# Patient Record
Sex: Female | Born: 1965
Health system: Southern US, Community
[De-identification: ages and names within clinical notes are randomized; demographics above are authoritative.]

## PROBLEM LIST (undated history)

## (undated) DIAGNOSIS — R7303 Prediabetes: Secondary | ICD-10-CM

## (undated) DIAGNOSIS — I10 Essential (primary) hypertension: Secondary | ICD-10-CM

---

## 2019-01-13 ENCOUNTER — Emergency Department (HOSPITAL_COMMUNITY)
Admission: EM | Admit: 2019-01-13 | Discharge: 2019-01-13 | Disposition: A | Discharge status: Home / Self Care | Payer: Federal, State, Local not specified - PPO | Attending: Emergency Medicine | Admitting: Emergency Medicine

## 2019-01-13 ENCOUNTER — Encounter (HOSPITAL_COMMUNITY): Payer: Self-pay | Admitting: *Deleted

## 2019-01-13 DIAGNOSIS — W57XXXA Bitten or stung by nonvenomous insect and other nonvenomous arthropods, initial encounter: Secondary | ICD-10-CM | POA: Insufficient documentation

## 2019-01-13 DIAGNOSIS — Y990 Civilian activity done for income or pay: Secondary | ICD-10-CM | POA: Insufficient documentation

## 2019-01-13 DIAGNOSIS — Y9389 Activity, other specified: Secondary | ICD-10-CM | POA: Diagnosis not present

## 2019-01-13 DIAGNOSIS — L089 Local infection of the skin and subcutaneous tissue, unspecified: Secondary | ICD-10-CM | POA: Diagnosis not present

## 2019-01-13 DIAGNOSIS — S80862A Insect bite (nonvenomous), left lower leg, initial encounter: Secondary | ICD-10-CM

## 2019-01-13 DIAGNOSIS — I10 Essential (primary) hypertension: Secondary | ICD-10-CM | POA: Diagnosis not present

## 2019-01-13 DIAGNOSIS — Y92242 Post office as the place of occurrence of the external cause: Secondary | ICD-10-CM | POA: Diagnosis not present

## 2019-01-13 DIAGNOSIS — R2242 Localized swelling, mass and lump, left lower limb: Secondary | ICD-10-CM | POA: Diagnosis not present

## 2019-01-13 HISTORY — DX: Essential (primary) hypertension: I10

## 2019-01-13 MED ORDER — AMLODIPINE BESYLATE 5 MG PO TABS: 5.0000 mg | ORAL_TABLET | Freq: Every day | ORAL | 0 refills | Status: DC

## 2019-01-13 NOTE — Discharge Instructions (Signed)
Follow up with a family doctor.  Keep the area covered to avoid irritation.  Benadryl and medicines like ibuprofen on naproxyn can help with the pain/itching.  Return for fever, rapid spreading redness.

## 2019-01-13 NOTE — ED Provider Notes (Signed)
[facility]-EMERGENCY DEPT Provider Note   CSN: [id number] Arrival date & time: 01/13/19  1821    History   Chief Complaint Chief Complaint  Patient presents with  . Leg Pain    HPI Joy Leonard is a 53 y.o. female.     53 yo F with a chief complaint of pain and swelling to the posterior aspect of her left calf.  This been going on for the past couple days.  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  When she got done with work she realized that there was a reddened swollen area.  Since then the blister is formed and become more tense.  She denies other areas of injury.  Denies shortness of breath vomiting or diarrhea.  The patient also has been without her antihypertensive medications as she is recently moved to the area.  Requesting a refill of her amlodipine.  The history is provided by the patient and a relative.  Leg Pain  Location:  Leg Time since incident:  2 days Injury: no   Leg location:  R lower leg Pain details:    Quality: itching.   Radiates to:  Does not radiate   Severity:  Mild   Onset quality:  Sudden   Duration:  2 days   Timing:  Constant   Progression:  Worsening Chronicity:  New Dislocation: no   Prior injury to area:  No Relieved by:  Nothing Worsened by:  Nothing Ineffective treatments:  None tried Associated symptoms: no fever     Past Medical History:  Diagnosis Date  . Hypertension     There are no active problems to display for this patient.   History reviewed. No pertinent surgical history.   OB History   No obstetric history on file.      Home Medications    Prior to Admission medications   Medication Sig Start Date End Date Taking? Authorizing Provider  amLODipine (NORVASC) 5 MG tablet Take 1 tablet (5 mg total) by mouth daily. 01/13/19   Melene Plan, DO    Family History No family history on file.  Social History Social History   Tobacco Use  .  Smoking status: Never Smoker  . Smokeless tobacco: Never Used  Substance Use Topics  . Alcohol use: Yes  . Drug use: Not on file     Allergies   Lisinopril   Review of Systems Review of Systems  Constitutional: Negative for chills and fever.  HENT: Negative for congestion and rhinorrhea.   Eyes: Negative for redness and visual disturbance.  Respiratory: Negative for shortness of breath and wheezing.   Cardiovascular: Negative for chest pain and palpitations.  Gastrointestinal: Negative for nausea and vomiting.  Genitourinary: Negative for dysuria and urgency.  Musculoskeletal: Negative for arthralgias and myalgias.  Skin: Positive for color change. Negative for pallor and wound.  Neurological: Negative for dizziness and headaches.     Physical Exam Updated Vital Signs BP (!) 180/112 (BP Location: Left Arm)   Pulse 77   Temp 98 F (36.7 C) (Oral)   Resp 18   SpO2 100%   Physical Exam Vitals signs and nursing note reviewed.  Constitutional:      General: She is not in acute distress.    Appearance: She is well-developed. She is not diaphoretic.  HENT:     Head: Normocephalic and atraumatic.  Eyes:     Pupils: Pupils are equal, round, and reactive to  light.  Neck:     Musculoskeletal: Normal range of motion and neck supple.  Cardiovascular:     Rate and Rhythm: Normal rate and regular rhythm.     Heart sounds: No murmur. No friction rub. No gallop.   Pulmonary:     Effort: Pulmonary effort is normal.     Breath sounds: No wheezing or rales.  Abdominal:     General: There is no distension.     Palpations: Abdomen is soft.     Tenderness: There is no abdominal tenderness.  Musculoskeletal:        General: No tenderness.  Skin:    General: Skin is warm and dry.     Comments: Raised area of erythema with a centralized fluid-filled blister.  Nontender.  Neurological:     Mental Status: She is alert and oriented to person, place, and time.  Psychiatric:         Behavior: Behavior normal.      ED Treatments / Results  Labs (all labs ordered are listed, but only abnormal results are displayed) Labs Reviewed - No data to display  EKG None  Radiology No results found.  Procedures Procedures (including critical care time)  Medications Ordered in ED Medications - No data to display   Initial Impression / Assessment and Plan / ED Course  I have reviewed the triage vital signs and the nursing notes.  Pertinent labs & imaging results that were available during my care of the patient were reviewed by me and considered in my medical decision making (see chart for details).        53 yo F with a chief complaint of an area of erythema and blistering.  Clinically this appears to be an insect bite with a localized reaction.  She has no other lesions no other signs of allergic reaction.  We will have her do symptomatic therapy at home.  PCP follow-up.  7:08 PM:  I have discussed the diagnosis/risks/treatment options with the patient and family and believe the pt to be eligible for discharge home to follow-up with PCP. We also discussed returning to the ED immediately if new or worsening sx occur. We discussed the sx which are most concerning (e.g., sudden worsening pain, fever, inability to tolerate by mouth) that necessitate immediate return. Medications administered to the patient during their visit and any new prescriptions provided to the patient are listed below.  Medications given during this visit Medications - No data to display   The patient appears reasonably screen and/or stabilized for discharge and I doubt any other medical condition or other [facility] requiring further screening, evaluation, or treatment in the ED at this time prior to discharge.    Final Clinical Impressions(s) / ED Diagnoses   Final diagnoses:  Insect bite of left lower leg with local reaction, initial encounter    ED Discharge Orders         Ordered     amLODipine (NORVASC) 5 MG tablet  Daily     01/13/19 1904           Melene Plan, DO 01/13/19 1908

## 2019-01-13 NOTE — ED Triage Notes (Signed)
Pt complains of left calf pain. Pt has blister on left calf. Pt is hypertensive in triage. Pt states she has not taken blood pressure medication since mid-February. Pt denies headache, blurred vision, or dizziness.

## 2019-01-31 ENCOUNTER — Encounter (HOSPITAL_COMMUNITY): Payer: Self-pay | Admitting: Emergency Medicine

## 2019-01-31 ENCOUNTER — Ambulatory Visit (HOSPITAL_COMMUNITY)
Admission: EM | Admit: 2019-01-31 | Discharge: 2019-01-31 | Disposition: A | Discharge status: Home / Self Care | Payer: Federal, State, Local not specified - PPO | Attending: Family Medicine | Admitting: Family Medicine

## 2019-01-31 ENCOUNTER — Other Ambulatory Visit: Payer: Self-pay

## 2019-01-31 DIAGNOSIS — I10 Essential (primary) hypertension: Secondary | ICD-10-CM | POA: Diagnosis not present

## 2019-01-31 MED ORDER — AMLODIPINE BESYLATE 10 MG PO TABS: 10.0000 mg | ORAL_TABLET | Freq: Every day | ORAL | 2 refills | Status: DC

## 2019-01-31 NOTE — ED Notes (Signed)
Patient verbalizes understanding of discharge instructions. Opportunity for questioning and answers were provided. Patient discharged from [facility] by MD. 

## 2019-01-31 NOTE — ED Triage Notes (Signed)
Requesting medication refill.  Amlodipine 5 mg-patient has 3 left.

## 2019-02-05 NOTE — ED Provider Notes (Signed)
[facility]   [id number] 01/31/19 Arrival Time: 1737  ASSESSMENT & PLAN:  1. Essential hypertension    Meds ordered this encounter  Medications  . amLODipine (NORVASC) 10 MG tablet    Sig: Take 1 tablet (10 mg total) by mouth daily.    Dispense:  30 tablet    Refill:  2   Refilled as requested; increase to 10mg . Encouraged to est care with PCP.  Reviewed expectations re: course of current medical issues. Questions answered. Outlined signs and symptoms indicating need for more acute intervention. Patient verbalized understanding. After Visit Summary given.   SUBJECTIVE:  Joy Leonard is a 53 y.o. female who presents with concerns regarding increased blood pressures. Needs refill of Norvasc 5mg . Reports that she will run out soon. Checks BP at home and reports consistent elevation above normal.  She reports taking medications as instructed, no medication side effects noted, no TIA's, no chest pain on exertion, no dyspnea on exertion, no swelling of ankles, no orthostatic dizziness or lightheadedness, no orthopnea or paroxysmal nocturnal dyspnea and no palpitations.  Denies symptoms of chest pain, palpations, orthopnea, nocturnal dyspnea, or LE edema.  Social History   Tobacco Use  Smoking Status Never Smoker  Smokeless Tobacco Never Used   ROS: As per HPI.   OBJECTIVE:  Vitals:   01/31/19 1815  BP: (!) 161/95  Pulse: 82  Resp: 18  Temp: 98.3 F (36.8 C)  TempSrc: Oral  SpO2: 99%    Elevated BP noted.  General appearance: alert; no distress Eyes: PERRLA; EOMI HENT: normocephalic; atraumatic Neck: supple Lungs: clear to auscultation bilaterally Heart: regular rate and rhythm Abdomen: soft, non-tender; bowel sounds normal Extremities: no edema; symmetrical with no gross deformities Skin: warm and dry Psychological: alert and cooperative; normal mood and affect  Allergies  Allergen Reactions  . Lisinopril Swelling    Past Medical  History:  Diagnosis Date  . Hypertension    Social History   Socioeconomic History  . Marital status: Single    Spouse name: Not on file  . Number of children: Not on file  . Years of education: Not on file  . Highest education level: Not on file  Occupational History  . Not on file  Social Needs  . Financial resource strain: Not on file  . Food insecurity:    Worry: Not on file    Inability: Not on file  . Transportation needs:    Medical: Not on file    Non-medical: Not on file  Tobacco Use  . Smoking status: Never Smoker  . Smokeless tobacco: Never Used  Substance and Sexual Activity  . Alcohol use: Yes  . Drug use: Not on file  . Sexual activity: Not on file  Lifestyle  . Physical activity:    Days per week: Not on file    Minutes per session: Not on file  . Stress: Not on file  Relationships  . Social connections:    Talks on phone: Not on file    Gets together: Not on file    Attends religious service: Not on file    Active member of club or organization: Not on file    Attends meetings of clubs or organizations: Not on file    Relationship status: Not on file  . Intimate partner violence:    Fear of current or ex partner: Not on file    Emotionally abused: Not on file    Physically abused: Not on file    Forced  sexual activity: Not on file  Other Topics Concern  . Not on file  Social History Narrative  . Not on file   Family History  Problem Relation Age of Onset  . Hypertension Mother    History reviewed. No pertinent surgical history.    Mardella LaymanHagler, Laporscha Linehan, MD 02/05/19 1406

## 2019-03-02 ENCOUNTER — Inpatient Hospital Stay (HOSPITAL_COMMUNITY)
Admission: EM | Admit: 2019-03-02 | Discharge: 2019-03-05 | DRG: 244 | Disposition: A | Discharge status: Home / Self Care | Payer: Federal, State, Local not specified - PPO | Attending: Internal Medicine | Admitting: Internal Medicine

## 2019-03-02 ENCOUNTER — Emergency Department (HOSPITAL_COMMUNITY): Payer: Federal, State, Local not specified - PPO

## 2019-03-02 ENCOUNTER — Other Ambulatory Visit: Payer: Self-pay

## 2019-03-02 ENCOUNTER — Encounter (HOSPITAL_COMMUNITY): Payer: Self-pay | Admitting: Emergency Medicine

## 2019-03-02 DIAGNOSIS — I4510 Unspecified right bundle-branch block: Secondary | ICD-10-CM | POA: Diagnosis not present

## 2019-03-02 DIAGNOSIS — Z20828 Contact with and (suspected) exposure to other viral communicable diseases: Secondary | ICD-10-CM | POA: Diagnosis not present

## 2019-03-02 DIAGNOSIS — Z888 Allergy status to other drugs, medicaments and biological substances status: Secondary | ICD-10-CM

## 2019-03-02 DIAGNOSIS — I442 Atrioventricular block, complete: Secondary | ICD-10-CM | POA: Diagnosis not present

## 2019-03-02 DIAGNOSIS — R42 Dizziness and giddiness: Secondary | ICD-10-CM | POA: Diagnosis not present

## 2019-03-02 DIAGNOSIS — R7303 Prediabetes: Secondary | ICD-10-CM | POA: Diagnosis present

## 2019-03-02 DIAGNOSIS — Z833 Family history of diabetes mellitus: Secondary | ICD-10-CM

## 2019-03-02 DIAGNOSIS — I10 Essential (primary) hypertension: Secondary | ICD-10-CM | POA: Diagnosis present

## 2019-03-02 DIAGNOSIS — Z79899 Other long term (current) drug therapy: Secondary | ICD-10-CM

## 2019-03-02 DIAGNOSIS — Z950 Presence of cardiac pacemaker: Secondary | ICD-10-CM

## 2019-03-02 DIAGNOSIS — R079 Chest pain, unspecified: Secondary | ICD-10-CM | POA: Diagnosis not present

## 2019-03-02 DIAGNOSIS — Z8249 Family history of ischemic heart disease and other diseases of the circulatory system: Secondary | ICD-10-CM

## 2019-03-02 LAB — CBC
HCT: 36 % (ref 36.0–46.0)
HCT: 36.2 % (ref 36.0–46.0)
Hemoglobin: 11.1 g/dL — ABNORMAL LOW (ref 12.0–15.0)
Hemoglobin: 11.5 g/dL — ABNORMAL LOW (ref 12.0–15.0)
MCH: 21.1 pg — ABNORMAL LOW (ref 26.0–34.0)
MCH: 21 pg — ABNORMAL LOW (ref 26.0–34.0)
MCHC: 30.8 g/dL (ref 30.0–36.0)
MCHC: 31.8 g/dL (ref 30.0–36.0)
MCV: 68.3 fL — ABNORMAL LOW (ref 80.0–100.0)
MCV: 66.1 fL — ABNORMAL LOW (ref 80.0–100.0)
Platelets: 168 10*3/uL (ref 150–400)
Platelets: 180 10*3/uL (ref 150–400)
RBC: 5.27 MIL/uL — ABNORMAL HIGH (ref 3.87–5.11)
RBC: 5.48 MIL/uL — ABNORMAL HIGH (ref 3.87–5.11)
RDW: 18.3 % — ABNORMAL HIGH (ref 11.5–15.5)
RDW: 17.3 % — ABNORMAL HIGH (ref 11.5–15.5)
WBC: 6.4 10*3/uL (ref 4.0–10.5)
WBC: 6.8 10*3/uL (ref 4.0–10.5)
nRBC: 0 % (ref 0.0–0.2)
nRBC: 0 % (ref 0.0–0.2)

## 2019-03-02 LAB — TROPONIN I
Troponin I: <0.03 ng/mL (ref <0.03)
Troponin I: <0.03 ng/mL (ref <0.03)

## 2019-03-02 LAB — BASIC METABOLIC PANEL
Anion gap: 9 (ref 5–15)
BUN: 25 mg/dL — ABNORMAL HIGH (ref 6–20)
CO2: 24 mmol/L (ref 22–32)
Calcium: 9.2 mg/dL (ref 8.9–10.3)
Chloride: 109 mmol/L (ref 98–111)
Creatinine, Ser: 0.85 mg/dL (ref 0.44–1.00)
GFR calc Af Amer: >60 mL/min (ref >60)
GFR calc non Af Amer: >60 mL/min (ref >60)
Glucose, Bld: 114 mg/dL — ABNORMAL HIGH (ref 70–99)
Potassium: 3.6 mmol/L (ref 3.5–5.1)
Sodium: 142 mmol/L (ref 135–145)

## 2019-03-02 LAB — CREATININE, SERUM
Creatinine, Ser: 0.75 mg/dL (ref 0.44–1.00)
GFR calc Af Amer: >60 mL/min (ref >60)
GFR calc non Af Amer: >60 mL/min (ref >60)

## 2019-03-02 LAB — MAGNESIUM: Magnesium: 2.3 mg/dL (ref 1.7–2.4)

## 2019-03-02 LAB — SARS CORONAVIRUS 2 BY RT PCR (HOSPITAL ORDER, PERFORMED IN ~~LOC~~ HOSPITAL LAB): SARS Coronavirus 2: NEGATIVE

## 2019-03-02 LAB — TSH: TSH: 1.107 u[IU]/mL (ref 0.350–4.500)

## 2019-03-02 LAB — HEMOGLOBIN A1C
Hgb A1c MFr Bld: 6.6 % — ABNORMAL HIGH (ref 4.8–5.6)
Mean Plasma Glucose: 142.72 mg/dL

## 2019-03-02 LAB — T4, FREE: Free T4: 0.77 ng/dL — ABNORMAL LOW (ref 0.82–1.77)

## 2019-03-02 LAB — MRSA PCR SCREENING: MRSA by PCR: NEGATIVE

## 2019-03-02 MED ORDER — ADULT MULTIVITAMIN W/MINERALS CH
1.0000 | ORAL_TABLET | Freq: Every day | ORAL | Status: DC
  Administered 2019-03-03 – 2019-03-05 (×3): 1 via ORAL
  Filled 2019-03-02 (×4): qty 1

## 2019-03-02 MED ORDER — ASPIRIN 81 MG PO CHEW: 324.0000 mg | CHEWABLE_TABLET | ORAL | Status: DC

## 2019-03-02 MED ORDER — AMLODIPINE BESYLATE 10 MG PO TABS
10.0000 mg | ORAL_TABLET | Freq: Every day | ORAL | Status: DC
  Administered 2019-03-03 – 2019-03-05 (×3): 10 mg via ORAL
  Filled 2019-03-02 (×4): qty 1

## 2019-03-02 MED ORDER — ASPIRIN 300 MG RE SUPP: 300.0000 mg | RECTAL | Status: DC

## 2019-03-02 MED ORDER — ASPIRIN EC 81 MG PO TBEC
81.0000 mg | DELAYED_RELEASE_TABLET | Freq: Every day | ORAL | Status: DC
  Administered 2019-03-03 – 2019-03-05 (×3): 81 mg via ORAL
  Filled 2019-03-02 (×5): qty 1

## 2019-03-02 MED ORDER — ACETAMINOPHEN 325 MG PO TABS: 650.0000 mg | ORAL_TABLET | ORAL | Status: DC | PRN

## 2019-03-02 MED ORDER — ONDANSETRON HCL 4 MG/2ML IJ SOLN: 4.0000 mg | Freq: Four times a day (QID) | INTRAMUSCULAR | Status: DC | PRN

## 2019-03-02 MED ORDER — SODIUM CHLORIDE 0.9% FLUSH
3.0000 mL | Freq: Once | INTRAVENOUS | Status: AC
  Administered 2019-03-02: 3 mL via INTRAVENOUS

## 2019-03-02 MED ORDER — NITROGLYCERIN 0.4 MG SL SUBL: 0.4000 mg | SUBLINGUAL_TABLET | SUBLINGUAL | Status: DC | PRN

## 2019-03-02 MED ORDER — ASPIRIN 81 MG PO CHEW
324.0000 mg | CHEWABLE_TABLET | Freq: Once | ORAL | Status: AC
  Administered 2019-03-02: 324 mg via ORAL
  Filled 2019-03-02: qty 4

## 2019-03-02 MED ORDER — HEPARIN SODIUM (PORCINE) 5000 UNIT/ML IJ SOLN
5000.0000 [IU] | Freq: Three times a day (TID) | INTRAMUSCULAR | Status: DC
  Administered 2019-03-02 – 2019-03-03 (×2): 5000 [IU] via SUBCUTANEOUS
  Filled 2019-03-02 (×2): qty 1

## 2019-03-02 NOTE — H&P (Addendum)
Cardiology Admission History and Physical:   Patient ID: Joy Leonard MRN: [id number]; DOB: December 22, 1965   Admission date: 03/02/2019  Primary Care Provider: Patient, No Pcp Per Cardiologist: New  Pt presents to ED [facility]) with chest pain/ dizziness  Patient Profile:   Joy Leonard is a 53 y.o. female with PMH of HTN and possible prediabetes presented with sudden onset of dyspnea, fatigue and chest mild tightness. She was found to be in new 3rd degree AV block by EKG  History of Present Illness:   Joy Leonard is a pleasant 53 year old female with hx of HTN.  She moved down to [place] from [place] in February 2020 to join her children.  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  She denies any prior cardiac issues.  She did mention prior to moving to [place], her previous PCP told her she has prediabetes.  Unfortunately she has not been established with a primary care doctor yet after moving.  She had a C-section in 1990 however no other surgery.  Her maternal aunt had a pacemaker and he eventually passed away due to heart issues.  She does not know what type of heart issues her aunt had.  She does not know her father very well.  Her mother has hypertension and diabetes.  She denies any history of alcohol abuse, tobacco abuse or use of illicit drug.  The only recent event was she went to the emergency room due to bug bite and to request a refill for her amlodipine.  She does not take any other blood pressure medications.  She had a history of angioedema/facial swelling with lisinopril.  She was essentially in her usual state of health until this morning.  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  Around 10 AM this morning, she started noticing increasing fatigue and the dizziness with walking.  This has never happened to her in the past.  She also had a mild chest tightness as well however denies any significant pain.  Symptom persisted  prompting the patient to seek medical attention at [facility] ED.  Initial EKG demonstrated third-degree AV block.  She was subsequently transferred to [facility] for further evaluation.  Lab work included a potassium of 3.6, troponin negative.  Hemoglobin 11.1.  White blood cell count normal.  Chest x-ray shows no acute issue.  Cardiology has been consulted for third-degree heart block.  Patient is resting comfortably in bed denying any chest pain, dizziness or fatigue when she is not moving.  Her systolic blood pressures in the 130s and heart rate 55.    Past Medical History:  Diagnosis Date  . Hypertension     Past Surgical History:  Procedure Laterality Date  . CESAREAN SECTION  1990     Medications Prior to Admission: Prior to Admission medications   Medication Sig Start Date End Date Taking? Authorizing Provider  amLODipine (NORVASC) 10 MG tablet Take 1 tablet (10 mg total) by mouth daily. 01/31/19   Mardella Layman, MD     Allergies:    Allergies  Allergen Reactions  . Lisinopril Swelling    Social History:   Social History   Socioeconomic History  . Marital status: Single    Spouse name: Not on file  . Number of children: Not on file  . Years of education: Not on file  . Highest education level: Not on file  Occupational History  . Not on file  Social Needs  .  Financial resource strain: Not on file  . Food insecurity:    Worry: Not on file    Inability: Not on file  . Transportation needs:    Medical: Not on file    Non-medical: Not on file  Tobacco Use  . Smoking status: Never Smoker  . Smokeless tobacco: Never Used  Substance and Sexual Activity  . Alcohol use: Yes  . Drug use: Never  . Sexual activity: Not on file  Lifestyle  . Physical activity:    Days per week: Not on file    Minutes per session: Not on file  . Stress: Not on file  Relationships  . Social connections:    Talks on phone: Not on file    Gets together: Not on file     Attends religious service: Not on file    Active member of club or organization: Not on file    Attends meetings of clubs or organizations: Not on file    Relationship status: Not on file  . Intimate partner violence:    Fear of current or ex partner: Not on file    Emotionally abused: Not on file    Physically abused: Not on file    Forced sexual activity: Not on file  Other Topics Concern  . Not on file  Social History Narrative  . Not on file    Family History:   The patient's family history includes Hypertension in her mother.    ROS:  Please see the history of present illness.  All other ROS reviewed and negative.     Physical Exam/Data:   Vitals:   03/02/19 1631 03/02/19 1633 03/02/19 1635 03/02/19 1637  BP:  (!) 159/86 (!) 159/86   Pulse:  (!) 55 (!) 55   Resp:  14 19   Temp:      TempSrc:      SpO2: 100% 100% 100%   Weight:    90.7 kg  Height:     (1.626 m)   No intake or output data in the 24 hours ending 03/02/19 1719 Last 3 Weights 03/02/2019  Weight (lbs) 200 lb  Weight (kg) 90.719 kg     Body mass index is 34.33 kg/m.    General:  Well nourished, well developed, in no acute distress HEENT: normal Lymph: no adenopathy Neck: no JVD Endocrine:  No thryomegaly Vascular: No carotid bruits; FA pulses 2+ bilaterally without bruits  Cardiac:  normal S1, S2; RRR; no murmur  Lungs:  clear to auscultation bilaterally, no wheezing, rhonchi or rales  Abd: soft, nontender, no hepatomegaly  Ext: no edema Musculoskeletal:  No deformities, BUE and BLE strength normal and equal Skin: warm and dry  Neuro:  CNs 2-12 intact, no focal abnormalities noted Psych:  Normal affect    EKG:  The ECG that was done 5/2/20was personally reviewed and demonstrates Sinus bradycardia 53 bpm   Complete heart block   RBBB  Relevant CV Studies: None    Laboratory Data:  Chemistry Recent Labs  Lab 03/02/19 1510  NA 142  K 3.6  CL 109  CO2 24  GLUCOSE 114*  BUN 25*   CREATININE 0.85  CALCIUM 9.2  GFRNONAA >60  GFRAA >60  ANIONGAP 9    No results for input(s): PROT, ALBUMIN, AST, ALT, ALKPHOS, BILITOT in the last 168 hours. Hematology Recent Labs  Lab 03/02/19 1510  WBC 6.4  RBC 5.27*  HGB 11.1*  HCT 36.0  MCV 68.3*  MCH 21.1*  MCHC 30.8  RDW 18.3*  PLT 168   Cardiac Enzymes Recent Labs  Lab 03/02/19 1510  TROPONINI <0.03   No results for input(s): TROPIPOC in the last 168 hours.  BNPNo results for input(s): BNP, PROBNP in the last 168 hours.  DDimer No results for input(s): DDIMER in the last 168 hours.  Radiology/Studies:  Dg Chest 2 View  Result Date: 03/02/2019 CLINICAL DATA:  Chest tightness, lightheadedness and weakness while working outside today, chest pain which worsened EXAM: CHEST - 2 VIEW COMPARISON:  None FINDINGS: Normal heart size, mediastinal contours, and pulmonary vascularity. Lungs clear. No pleural effusion or pneumothorax. Bones unremarkable. IMPRESSION: Normal exam. Electronically Signed   By: Ulyses Southward M.D.   On: 03/02/2019 15:00    Assessment and Plan:   1. Conduction dz/Complete heart block  -Patient is not on any AV nodal blocking agent.  Symptoms started suddenly around 10 AM this morning.  Prior to that, patient was in her usual state of health.  -Despite third-degree heart block, her vitals stable and she is currently asymptomatic at rest.  Will admit to stepdown and plan for pacemaker implantation either tomorrow or Monday.  -Obtain TSH.   2.  Chest tightness: Mild chest tightness in the setting of third-degree AV block.  No prior exertional symptom before today.  Will discuss with MD, potentially consider coronary CT since she does not have significant risk factor.  3. HTN: On amlodipine.  No AV nodal blocking agent.  History of angioedema with ACE inhibitor.  4.  Prediabetes: She was informed of this by her previous PCP prior to leaving [place].  Will obtain hemoglobin A1c.    Severity of  Illness: The appropriate patient status for this patient is INPATIENT. Inpatient status is judged to be reasonable and necessary in order to provide the required intensity of service to ensure the patient's safety. The patient's presenting symptoms, physical exam findings, and initial radiographic and laboratory data in the context of their chronic comorbidities is felt to place them at high risk for further clinical deterioration. Furthermore, it is not anticipated that the patient will be medically stable for discharge from the hospital within 2 midnights of admission. The following factors support the patient status of inpatient.   " The patient's presenting symptoms include dyspnea and fatigue. " The worrisome physical exam findings include benign " The initial radiographic and laboratory data are worrisome because of third degree AV block. " The chronic co-morbidities include HTN.   * I certify that at the point of admission it is my clinical judgment that the patient will require inpatient hospital care spanning beyond 2 midnights from the point of admission due to high intensity of service, high risk for further deterioration and high frequency of surveillance required.*    For questions or updates, please contact [facility] Please consult [email or web address] for contact info under        Signed, Azalee Course, [place]  03/02/2019 5:19 PM   Patient seen and examined with the above-signed Advanced Practice Provider and/or Housestaff. I personally reviewed laboratory data, imaging studies and relevant notes. I independently examined the patient and formulated the important aspects of the plan. I have edited the note to reflect any of my changes or salient points. I have personally discussed the plan with the patient and/or family.  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  Very active. Denies any h/o cardiac issues. Presented with exertional fatigue and dizziness with  mild chest tightness. Found to have CHG with underlying RBBB. HRs in 50s. Troponin negative. K ok. Not on any AV nodal blockers.   On exam comfortable 2/6 TR  Suspect CHB due to underlying conduction disease but seems premature. Doubt ischemic. Will get echo. If EF normal, would favor cMRI prior to PPM to assess for possible infiltrative disease. Will d/w EP in am. Admit to SDU with [facility] at bedside.   Arvilla Meresaniel , MD  6:34 PM

## 2019-03-02 NOTE — ED Notes (Signed)
[facility] at bedside for pt transportation.

## 2019-03-02 NOTE — ED Notes (Signed)
ED TO INPATIENT HANDOFF REPORT  ED Nurse Name and Phone #: Everlene FarrierHilary Darleen Moffitt, RN [phone]  S Name/Age/Gender Joy Leonard 53 y.o. female Room/Bed: 034C/034C  Code Status   Code Status: Not on file  Home/SNF/Other Home Patient oriented to: self, place, time and situation Is this baseline? Yes   Triage Complete: Triage complete  Chief Complaint dizziness  Triage Note Pt c/o dizziness and chest tightness while working today. Pt states symptoms worsen with ambulation.    Allergies Allergies  Allergen Reactions  . Lisinopril Swelling    Level of Care/Admitting Diagnosis ED Disposition    ED Disposition Condition Comment   Admit  [facility] [[id number]]  Level of Care: Progressive [102]  Covid Evaluation: Person Under Investigation (PUI)  Isolation Risk Level: Comment  Comment: COVID 19 test negative  Diagnosis: Complete heart block [facility](HCC) [[id number]]) [[id number]]  Admitting Physician: Dolores PattyBENSIMHON, DANIEL R [[id number]]  Attending Physician: Dolores PattyBENSIMHON, DANIEL R [[id number]]  Estimated length of stay: past midnight tomorrow  Certification:: I certify this patient will need inpatient services for at least 2 midnights  PT Class (Do Not Modify): Inpatient [101]  PT Acc Code (Do Not Modify): Private [1]       B Medical/Surgery History Past Medical History:  Diagnosis Date  . Hypertension    Past Surgical History:  Procedure Laterality Date  . CESAREAN SECTION  1990     A IV Location/Drains/Wounds Patient Lines/Drains/Airways Status   Active Line/Drains/Airways    None          Intake/Output Last 24 hours No intake or output data in the 24 hours ending 03/02/19 1742  Labs/Imaging Results for orders placed or performed during the hospital encounter of 03/02/19 (from the past 48 hour(s))  Basic metabolic panel     Status: Abnormal   Collection Time: 03/02/19  3:10 PM  Result Value Ref Range   Sodium 142 135 - 145 mmol/L   Potassium 3.6 3.5 - 5.1  mmol/L   Chloride 109 98 - 111 mmol/L   CO2 24 22 - 32 mmol/L   Glucose, Bld 114 (H) 70 - 99 mg/dL   BUN 25 (H) 6 - 20 mg/dL   Creatinine, Ser 4.690.85 0.44 - 1.00 mg/dL   Calcium 9.2 8.9 - 62.910.3 mg/dL   GFR calc non Af Amer >60 >60 mL/min   GFR calc Af Amer >60 >60 mL/min   Anion gap 9 5 - 15    Comment: Performed at [facility], [address] [place], [place] [address]  CBC     Status: Abnormal   Collection Time: 03/02/19  3:10 PM  Result Value Ref Range   WBC 6.4 4.0 - 10.5 K/uL   RBC 5.27 (H) 3.87 - 5.11 MIL/uL   Hemoglobin 11.1 (L) 12.0 - 15.0 g/dL   HCT 32.436.0 40.136.0 - 02.746.0 %   MCV 68.3 (L) 80.0 - 100.0 fL   MCH 21.1 (L) 26.0 - 34.0 pg   MCHC 30.8 30.0 - 36.0 g/dL   RDW 25.318.3 (H) 66.411.5 - 40.315.5 %   Platelets 168 150 - 400 K/uL    Comment: REPEATED TO VERIFY PLATELET COUNT CONFIRMED BY SMEAR SPECIMEN CHECKED FOR CLOTS    nRBC 0.0 0.0 - 0.2 %    Comment: Performed at [facility], [address] [place], [place] [address]  Troponin I - ONCE - STAT     Status: None   Collection Time: 03/02/19  3:10 PM  Result Value Ref  Range   Troponin I <0.03 <0.03 ng/mL    Comment: Performed at [facility], [address], [place], [place] [address]  Magnesium     Status: None   Collection Time: 03/02/19  3:11 PM  Result Value Ref Range   Magnesium 2.3 1.7 - 2.4 mg/dL    Comment: Performed at [facility], [address], [place], [place] [address]  SARS Coronavirus 2 [facility] order, Performed in [facility] hospital lab)     Status: None   Collection Time: 03/02/19  3:24 PM  Result Value Ref Range   SARS Coronavirus 2 NEGATIVE NEGATIVE    Comment: (NOTE) If result is NEGATIVE SARS-CoV-2 target nucleic acids are NOT DETECTED. The SARS-CoV-2 RNA is generally detectable in upper and lower  respiratory specimens during the acute phase of infection. The lowest  concentration of SARS-CoV-2 viral copies this assay can  detect is 250  copies / mL. A negative result does not preclude SARS-CoV-2 infection  and should not be used as the sole basis for treatment or other  patient management decisions.  A negative result may occur with  improper specimen collection / handling, submission of specimen other  than nasopharyngeal swab, presence of viral mutation(s) within the  areas targeted by this assay, and inadequate number of viral copies  (<250 copies / mL). A negative result must be combined with clinical  observations, patient history, and epidemiological information. If result is POSITIVE SARS-CoV-2 target nucleic acids are DETECTED. The SARS-CoV-2 RNA is generally detectable in upper and lower  respiratory specimens dur ing the acute phase of infection.  Positive  results are indicative of active infection with SARS-CoV-2.  Clinical  correlation with patient history and other diagnostic information is  necessary to determine patient infection status.  Positive results do  not rule out bacterial infection or co-infection with other viruses. If result is PRESUMPTIVE POSTIVE SARS-CoV-2 nucleic acids MAY BE PRESENT.   A presumptive positive result was obtained on the submitted specimen  and confirmed on repeat testing.  While 2019 novel coronavirus  (SARS-CoV-2) nucleic acids may be present in the submitted sample  additional confirmatory testing may be necessary for epidemiological  and / or clinical management purposes  to differentiate between  SARS-CoV-2 and other Sarbecovirus currently known to infect humans.  If clinically indicated additional testing with an alternate test  methodology [id number]) is advised. The SARS-CoV-2 RNA is generally  detectable in upper and lower respiratory sp ecimens during the acute  phase of infection. The expected result is Negative. Fact Sheet for Patients:  BoilerBrush.com.cy Fact Sheet for Healthcare  Providers: https://pope.com/ This test is not yet approved or cleared by the [place] FDA and has been authorized for detection and/or diagnosis of SARS-CoV-2 by FDA under an Emergency Use Authorization (EUA).  This EUA will remain in effect (meaning this test can be used) for the duration of the COVID-19 declaration under Section 564(b)(1) of the Act, 21 U.S.C. section 360bbb-3(b)(1), unless the authorization is terminated or revoked sooner. Performed at [facility], [address] [place], [place] [address]    Dg Chest 2 View  Result Date: 03/02/2019 CLINICAL DATA:  Chest tightness, lightheadedness and weakness while working outside today, chest pain which worsened EXAM: CHEST - 2 VIEW COMPARISON:  None FINDINGS: Normal heart size, mediastinal contours, and pulmonary vascularity. Lungs clear. No pleural effusion or pneumothorax. Bones unremarkable. IMPRESSION: Normal exam. Electronically Signed   By: Angelyn Punt.D.  On: 03/02/2019 15:00    Pending Labs [employer] (From admission, onward)    Start     Ordered   Signed and Held  HIV antibody (Routine Testing)  Once,   R     Signed and Held   Signed and Held  CBC  (heparin)  Once,   R    Comments:  Baseline for heparin therapy IF NOT ALREADY DRAWN.  Notify MD if PLT < 100 K.    Signed and Held   Signed and Held  Creatinine, serum  (heparin)  Once,   R    Comments:  Baseline for heparin therapy IF NOT ALREADY DRAWN.    Signed and Held   Signed and Held  Troponin I - Now Then Q6H  Now then every 6 hours,   STAT     Signed and Held   Signed and Held  TSH  Once,   R     Signed and Held   Signed and Held  T4, free  Once,   R     Signed and Held   Signed and Armed forces training and education officer morning,   R     Signed and Held   Signed and Held  Hemoglobin A1c  Once,   R     Signed and Held   Signed and Held  Lipid panel  Tomorrow morning,   R     Signed and Held           Vitals/Pain Today's Vitals   03/02/19 1646 03/02/19 1704 03/02/19 1720 03/02/19 1722  BP: (!) 130/55 135/77 129/78 129/78  Pulse: (!) 53 (!) 57 (!) 55 (!) 52  Resp: 20 15 20 13   Temp:      TempSrc:      SpO2: 100% 100% 100% 100%  Weight:      Height:      PainSc:        Isolation Precautions No active isolations  Medications Medications  sodium chloride flush (NS) 0.9 % injection 3 mL (3 mLs Intravenous Given 03/02/19 1514)  aspirin chewable tablet 324 mg (324 mg Oral Given 03/02/19 1520)    Mobility walks Low fall risk   Focused Assessments Cardiac Assessment Handoff:    Lab Results  Component Value Date   TROPONINI <0.03 03/02/2019   No results found for: DDIMER Does the Patient currently have chest pain? No       R Recommendations: See Admitting Provider Note  Report given to:   Additional Notes: .

## 2019-03-02 NOTE — ED Provider Notes (Signed)
Patient seen on arrival after transfer from our affiliated facility.  On here, she notes that her dizziness has improved She continues to have evidence for heart block, both on EMS rhythm strip, and on monitor here. I discussed her case with our cardiology colleagues for admission.   Gerhard Munch, MD 03/02/19 [phone]

## 2019-03-02 NOTE — ED Notes (Signed)
Pt placed on Zoll pads.  Pt denies chest tightness at this time.  Pt AOx4.

## 2019-03-02 NOTE — ED Notes (Signed)
Pt has been increased to level 1 due to complete heart block.

## 2019-03-02 NOTE — ED Triage Notes (Signed)
Pt c/o dizziness and chest tightness while working today. Pt states symptoms worsen with ambulation.

## 2019-03-02 NOTE — ED Notes (Signed)
Report given to [facility], [facility].

## 2019-03-02 NOTE — ED Notes (Signed)
Patient transported to X-ray 

## 2019-03-02 NOTE — ED Notes (Signed)
Attempted report X2 

## 2019-03-02 NOTE — ED Provider Notes (Signed)
[facility]-EMERGENCY DEPT Provider Note   CSN: [id number] Arrival date & time: 03/02/19  1417    History   Chief Complaint Chief Complaint  Patient presents with  . Dizziness  . Chest Pain    HPI Joy Leonard is a 53 y.o. female.     The history is provided by the patient and medical records. No language interpreter was used.  Dizziness  Associated symptoms: chest pain   Chest Pain  Associated symptoms: dizziness    Joy Leonard is a 53 y.o. female who presents to the Emergency Department complaining of chest pain. She presents to the emergency department complaining of central chest pain described as a tightness that began around noon today. This occurred while she was at work. 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 She had associated shortness of breath and fatigue. Overall her symptoms are significantly improving but she does have some mild chest tightness. No prior similar symptoms. No recent illnesses. She does have a history of hypertension and takes amlodipine daily. She denies any tobacco use. She uses occasional alcohol, no street drugs. No known COVID exposures. She did recently moved to the area from [place] in [place]. There is no family history of cardiac disease.  Sxs are severe in nature.   Past Medical History:  Diagnosis Date  . Hypertension     There are no active problems to display for this patient.   History reviewed. No pertinent surgical history.   OB History   No obstetric history on file.      Home Medications    Prior to Admission medications   Medication Sig Start Date End Date Taking? Authorizing Provider  amLODipine (NORVASC) 10 MG tablet Take 1 tablet (10 mg total) by mouth daily. 01/31/19   Mardella LaymanHagler, Brian, MD    Family History Family History  Problem Relation Age of Onset  . Hypertension Mother     Social History Social History   Tobacco Use  . Smoking status: Never Smoker  . Smokeless tobacco: Never  Used  Substance Use Topics  . Alcohol use: Yes  . Drug use: Never     Allergies   Lisinopril   Review of Systems Review of Systems  Cardiovascular: Positive for chest pain.  Neurological: Positive for dizziness.  All other systems reviewed and are negative.    Physical Exam Updated Vital Signs BP (!) 153/91 (BP Location: Left Arm)   Pulse (!) 54   Temp 97.8 F (36.6 C) (Oral)   Resp 18   SpO2 100%   Physical Exam Vitals signs and nursing note reviewed.  Constitutional:      Appearance: She is well-developed.  HENT:     Head: Normocephalic and atraumatic.  Cardiovascular:     Rate and Rhythm: Regular rhythm. Bradycardia present.  Pulmonary:     Effort: Pulmonary effort is normal. No respiratory distress.  Abdominal:     Palpations: Abdomen is soft.     Tenderness: There is no abdominal tenderness. There is no guarding or rebound.  Musculoskeletal:        General: No swelling or tenderness.     Comments: 2+ radial pulses bilaterally  Skin:    General: Skin is warm and dry.  Neurological:     Mental Status: She is alert and oriented to person, place, and time.  Psychiatric:        Behavior: Behavior normal.      ED Treatments / Results  Labs (all labs ordered are listed,  but only abnormal results are displayed) Labs Reviewed  BASIC METABOLIC PANEL - Abnormal; Notable for the following components:      Result Value   Glucose, Bld 114 (*)    BUN 25 (*)    All other components within normal limits  CBC - Abnormal; Notable for the following components:   RBC 5.27 (*)    Hemoglobin 11.1 (*)    MCV 68.3 (*)    MCH 21.1 (*)    RDW 18.3 (*)    All other components within normal limits  SARS CORONAVIRUS 2 (HOSPITAL ORDER, PERFORMED IN [facility] LAB)  TROPONIN I  MAGNESIUM  HIV ANTIBODY (ROUTINE TESTING W REFLEX)  CBC  CREATININE, SERUM  TROPONIN I  TROPONIN I  TROPONIN I  TSH  T4, FREE  BASIC METABOLIC PANEL  HEMOGLOBIN A1C  LIPID  PANEL    EKG None  Radiology Dg Chest 2 View  Result Date: 03/02/2019 CLINICAL DATA:  Chest tightness, lightheadedness and weakness while working outside today, chest pain which worsened EXAM: CHEST - 2 VIEW COMPARISON:  None FINDINGS: Normal heart size, mediastinal contours, and pulmonary vascularity. Lungs clear. No pleural effusion or pneumothorax. Bones unremarkable. IMPRESSION: Normal exam. Electronically Signed   By: Ulyses Southward M.D.   On: 03/02/2019 15:00    Procedures Procedures (including critical care time) CRITICAL CARE Performed by: Tilden Fossa   Total critical care time: 35 minutes  Critical care time was exclusive of separately billable procedures and treating other patients.  Critical care was necessary to treat or prevent imminent or life-threatening deterioration.  Critical care was time spent personally by me on the following activities: development of treatment plan with patient and/or surrogate as well as nursing, discussions with consultants, evaluation of patient's response to treatment, examination of patient, obtaining history from patient or surrogate, ordering and performing treatments and interventions, ordering and review of laboratory studies, ordering and review of radiographic studies, pulse oximetry and re-evaluation of patient's condition.  Medications Ordered in ED Medications  aspirin chewable tablet 324 mg (has no administration in time range)  sodium chloride flush (NS) 0.9 % injection 3 mL (3 mLs Intravenous Given 03/02/19 1514)     Initial Impression / Assessment and Plan / ED Course  I have reviewed the triage vital signs and the nursing notes.  Pertinent labs & imaging results that were available during my care of the patient were reviewed by me and considered in my medical decision making (see chart for details).        Pt with hx/o HTN here for evaluation of chest pain and dizziness.  She is well perfused but bradycardic on exam.   EKG with third degree heart block. D/w Dr. Tenny Craw with Cardiology.  Plan to transfer to [facility] ED for Cardiology evaluation with plan for admission.  EDP, Dr. Juleen China at [facility] accepted the patient in transfer.    Patient updated of findings of studies and recommendation for admission and she is in agreement with plan.  Pt recently moved from [place], no current sxs but in anticipation of admission with possible pacemaker placement will screen for COVID19.   Joy Leonard was evaluated in Emergency Department on 03/02/2019 for the symptoms described in the history of present illness. She was evaluated in the context of the global COVID-19 pandemic, which necessitated consideration that the patient might be at risk for infection with the SARS-CoV-2 virus that causes COVID-19. Institutional protocols and algorithms that pertain to the evaluation  of patients at risk for COVID-19 are in a state of rapid change based on information released by regulatory bodies including the CDC and federal and state organizations. These policies and algorithms were followed during the patient's care in the ED.   Final Clinical Impressions(s) / ED Diagnoses   Final diagnoses:  Complete heart block [facility])    ED Discharge Orders    None       Tilden Fossa, MD 03/02/19 1905

## 2019-03-02 NOTE — ED Notes (Signed)
Pt transfered from [facility] via [place]. Ed DR. At bedside. No pain at this time.

## 2019-03-03 ENCOUNTER — Inpatient Hospital Stay (HOSPITAL_COMMUNITY): Payer: Federal, State, Local not specified - PPO

## 2019-03-03 DIAGNOSIS — I442 Atrioventricular block, complete: Secondary | ICD-10-CM

## 2019-03-03 DIAGNOSIS — I10 Essential (primary) hypertension: Secondary | ICD-10-CM

## 2019-03-03 DIAGNOSIS — R7303 Prediabetes: Secondary | ICD-10-CM | POA: Diagnosis not present

## 2019-03-03 DIAGNOSIS — I4510 Unspecified right bundle-branch block: Secondary | ICD-10-CM | POA: Diagnosis not present

## 2019-03-03 LAB — BASIC METABOLIC PANEL
Anion gap: 9 (ref 5–15)
BUN: 12 mg/dL (ref 6–20)
CO2: 23 mmol/L (ref 22–32)
Calcium: 9.2 mg/dL (ref 8.9–10.3)
Chloride: 108 mmol/L (ref 98–111)
Creatinine, Ser: 0.7 mg/dL (ref 0.44–1.00)
GFR calc Af Amer: >60 mL/min (ref >60)
GFR calc non Af Amer: >60 mL/min (ref >60)
Glucose, Bld: 99 mg/dL (ref 70–99)
Potassium: 4 mmol/L (ref 3.5–5.1)
Sodium: 140 mmol/L (ref 135–145)

## 2019-03-03 LAB — TROPONIN I
Troponin I: <0.03 ng/mL (ref <0.03)
Troponin I: <0.03 ng/mL (ref <0.03)

## 2019-03-03 LAB — ECHOCARDIOGRAM COMPLETE
Height: 64 in
Weight: 3199.32 oz

## 2019-03-03 LAB — LIPID PANEL
Cholesterol: 170 mg/dL (ref 0–200)
HDL: 59 mg/dL (ref >40)
LDL Cholesterol: 94 mg/dL (ref 0–99)
Total CHOL/HDL Ratio: 2.9 RATIO
Triglycerides: 84 mg/dL (ref <150)
VLDL: 17 mg/dL (ref 0–40)

## 2019-03-03 LAB — HIV ANTIBODY (ROUTINE TESTING W REFLEX): HIV Screen 4th Generation wRfx: NONREACTIVE

## 2019-03-03 MED ORDER — SODIUM CHLORIDE 0.9 % IV SOLN
80.0000 mg | INTRAVENOUS | Status: AC
  Administered 2019-03-04: 14:00:00 80 mg
  Filled 2019-03-03: qty 2

## 2019-03-03 MED ORDER — CEFAZOLIN SODIUM-DEXTROSE 2-4 GM/100ML-% IV SOLN
2.0000 g | INTRAVENOUS | Status: AC
  Administered 2019-03-04: 2 g via INTRAVENOUS
  Filled 2019-03-03: qty 100

## 2019-03-03 MED ORDER — SODIUM CHLORIDE 0.9 % IV SOLN
INTRAVENOUS | Status: DC
  Administered 2019-03-04: 06:00:00 via INTRAVENOUS

## 2019-03-03 MED ORDER — SODIUM CHLORIDE 0.45 % IV SOLN
INTRAVENOUS | Status: DC
  Administered 2019-03-04: 06:00:00 via INTRAVENOUS

## 2019-03-03 NOTE — Progress Notes (Signed)
*  PRELIMINARY RESULTS* Echocardiogram 2D Echocardiogram has been performed.  Joy Leonard 03/03/2019, 11:16 AM

## 2019-03-03 NOTE — Consult Note (Signed)
ELECTROPHYSIOLOGY CONSULT NOTE    Primary Care Physician: Patient, No Pcp Per Referring Physician:  Dr Gala RomneyBensimhon  Admit Date: 03/02/2019  Reason for consultation:  AV block  Joy Leonard is a 53 y.o. female with a h/o HTN who is admitted with symptomatic AV block.   [facility] with symptoms of fatigue, chest tightness and dizziniess.  Upon arrival she was noted to have complete heart block. In retrospect, she has not had prior episodes.  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 Overnight, she has had intermittent mobitz II second degree AV block, though primarily has had 1:1 conduction of sinus rhythm. Today, she denies symptoms of palpitations, chest pain, shortness of breath, orthopnea, PND, lower extremity edema, dizziness, presyncope, syncope, or neurologic sequela. The patient is tolerating medications without difficulties and is otherwise without complaint today.   Past Medical History:  Diagnosis Date  . Hypertension    Past Surgical History:  Procedure Laterality Date  . CESAREAN SECTION  1990    . amLODipine  10 mg Oral Daily  . aspirin EC  81 mg Oral Daily  . heparin  5,000 Units Subcutaneous Q8H  . multivitamin with minerals  1 tablet Oral Daily    Allergies  Allergen Reactions  . Lisinopril Swelling    Social History   Socioeconomic History  . Marital status: Single    Spouse name: Not on file  . Number of children: Not on file  . Years of education: Not on file  . Highest education level: Not on file  Occupational History  . Not on file  Social Needs  . Financial resource strain: Not hard at all  . Food insecurity:    Worry: Never true    Inability: Never true  . Transportation needs:    Medical: No    Non-medical: No  Tobacco Use  . Smoking status: Never Smoker  . Smokeless tobacco: Never Used  Substance and Sexual Activity  . Alcohol use: Never    Frequency: Never  . Drug use: Never  . Sexual  activity: Not on file  Lifestyle  . Physical activity:    Days per week: 5 days    Minutes per session: 100 min  . Stress: Not at all  Relationships  . Social connections:    Talks on phone: More than three times a week    Gets together: More than three times a week    Attends religious service: More than 4 times per year    Active member of club or organization: No    Attends meetings of clubs or organizations: Never    Relationship status: Divorced  . Intimate partner violence:    Fear of current or ex partner: No    Emotionally abused: No    Physically abused: No    Forced sexual activity: No  Other Topics Concern  . Not on file  Social History Narrative  . Not on file    Family History  Problem Relation Age of Onset  . Hypertension Mother   . Diabetes Mellitus II Mother   . Dementia Father   . Heart disease Other        had pacemaker  her aunt had a pacemaker at an elderly age.  Denies FH of early onset AV block, arrhythmias or CAD.  ROS- All systems are reviewed and negative except as per the HPI above  Physical Exam: Telemetry: sinus rhythm with 1:1 AV conduction primarily, though occasional  mobitz II second degree AV block is observed. Vitals:   03/03/19 0000 03/03/19 0300 03/03/19 0400 03/03/19 0756  BP: (!) 133/92 (!) 111/97 131/73 (!) 159/84  Pulse: 63 (!) 58 62 76  Resp: 13 12 14 17   Temp:  98.6 F (37 C)  98.7 F (37.1 C)  TempSrc:  Oral  Oral  SpO2: 100% 100% 100% 97%  Weight:      Height:        GEN- The patient is well appearing, alert and oriented x 3 today.   Head- normocephalic, atraumatic Eyes-  Sclera clear, conjunctiva pink Ears- hearing intact Oropharynx- clear Neck- supple, no JVP Lymph- no cervical lymphadenopathy Lungs-  normal work of breathing Heart- Regular rate and rhythm  GI- soft, NT, ND, + BS Extremities- no clubbing, cyanosis, or edema MS- no significant deformity or atrophy Skin- no rash or lesion Psych- euthymic mood,  full affect Neuro- strength and sensation are intact  EKG 03/02/2019 sinus rhythm with complete heart block and RBB escape. ekg 03/03/2019 sinus rhythm with 1:1 conduction, RBBB  Labs:   Lab Results  Component Value Date   WBC 6.8 03/02/2019   HGB 11.5 (L) 03/02/2019   HCT 36.2 03/02/2019   MCV 66.1 (L) 03/02/2019   PLT 180 03/02/2019    Recent Labs  Lab 03/03/19 0624  NA 140  K 4.0  CL 108  CO2 23  BUN 12  CREATININE 0.70  CALCIUM 9.2  GLUCOSE 99   Lab Results  Component Value Date   TROPONINI <0.03 03/03/2019    Lab Results  Component Value Date   CHOL 170 03/03/2019   Lab Results  Component Value Date   HDL 59 03/03/2019   Lab Results  Component Value Date   LDLCALC 94 03/03/2019   Lab Results  Component Value Date   TRIG 84 03/03/2019   Lab Results  Component Value Date   CHOLHDL 2.9 03/03/2019   No results found for: LDLDIRECT    Radiology:CXR 03/02/2019- normal  Echo: pending  ASSESSMENT AND PLAN:   1. Mobitz II second degree AV block with intermittent complete heart block The patient has symptomatic AV block.  She has advanced conduction system disease by ekg.  No reversible causes are found.  I would therefore recommend pacemaker implantation.  Echo is pending.  I agree with Dr Gala Romney that given her young age, infiltrative diseases should be excluded with MRI.  I would therefore anticipate PPM tomorrow after MRI is obtained.  Risks, benefits, alternatives to pacemaker implantation were discussed in detail with the patient today. The patient understands that the risks include but are not limited to bleeding, infection, pneumothorax, perforation, tamponade, vascular damage, renal failure, MI, stroke, death,  and lead dislodgement and wishes to proceed. We will therefore schedule the procedure at the next available time.  I will make NPO after midnight for Dr Ladona Ridgel to proceed tomorrow. No changes currently.  She is stable and primarily 1:1 AV  conducting.  2. HTN Stable No change required today   Hillis Range, MD 03/03/2019  10:07 AM

## 2019-03-04 ENCOUNTER — Inpatient Hospital Stay (HOSPITAL_COMMUNITY): Payer: Federal, State, Local not specified - PPO

## 2019-03-04 ENCOUNTER — Inpatient Hospital Stay (HOSPITAL_COMMUNITY)
Admission: EM | Disposition: A | Discharge status: Home / Self Care | Payer: Self-pay | Source: Home / Self Care | Attending: Internal Medicine

## 2019-03-04 DIAGNOSIS — I4510 Unspecified right bundle-branch block: Secondary | ICD-10-CM | POA: Diagnosis not present

## 2019-03-04 DIAGNOSIS — I442 Atrioventricular block, complete: Secondary | ICD-10-CM | POA: Diagnosis not present

## 2019-03-04 DIAGNOSIS — R7303 Prediabetes: Secondary | ICD-10-CM | POA: Diagnosis not present

## 2019-03-04 DIAGNOSIS — I10 Essential (primary) hypertension: Secondary | ICD-10-CM | POA: Diagnosis not present

## 2019-03-04 HISTORY — PX: PACEMAKER IMPLANT: EP1218

## 2019-03-04 SURGERY — PACEMAKER IMPLANT
Anesthesia: LOCAL

## 2019-03-04 MED ORDER — HEPARIN (PORCINE) IN NACL 1000-0.9 UT/500ML-% IV SOLN
INTRAVENOUS | Status: AC
  Filled 2019-03-04: qty 500

## 2019-03-04 MED ORDER — LIDOCAINE HCL 1 % IJ SOLN
INTRAMUSCULAR | Status: AC
  Filled 2019-03-04: qty 60

## 2019-03-04 MED ORDER — CEFAZOLIN SODIUM-DEXTROSE 1-4 GM/50ML-% IV SOLN
1.0000 g | Freq: Four times a day (QID) | INTRAVENOUS | Status: DC
  Administered 2019-03-04 – 2019-03-05 (×2): 1 g via INTRAVENOUS
  Filled 2019-03-04 (×3): qty 50

## 2019-03-04 MED ORDER — FENTANYL CITRATE (PF) 100 MCG/2ML IJ SOLN
INTRAMUSCULAR | Status: AC
  Filled 2019-03-04: qty 2

## 2019-03-04 MED ORDER — ACETAMINOPHEN 325 MG PO TABS: 325.0000 mg | ORAL_TABLET | ORAL | Status: DC | PRN

## 2019-03-04 MED ORDER — ONDANSETRON HCL 4 MG/2ML IJ SOLN: 4.0000 mg | Freq: Four times a day (QID) | INTRAMUSCULAR | Status: DC | PRN

## 2019-03-04 MED ORDER — CEFAZOLIN SODIUM-DEXTROSE 2-4 GM/100ML-% IV SOLN
INTRAVENOUS | Status: AC
  Filled 2019-03-04: qty 100

## 2019-03-04 MED ORDER — LIDOCAINE HCL (PF) 1 % IJ SOLN
INTRAMUSCULAR | Status: DC | PRN
  Administered 2019-03-04: 14:00:00 50 mL

## 2019-03-04 MED ORDER — GADOBUTROL 1 MMOL/ML IV SOLN
10.0000 mL | Freq: Once | INTRAVENOUS | Status: AC | PRN
  Administered 2019-03-04: 09:00:00 10 mL via INTRAVENOUS

## 2019-03-04 MED ORDER — MIDAZOLAM HCL 5 MG/5ML IJ SOLN
INTRAMUSCULAR | Status: AC
  Filled 2019-03-04: qty 5

## 2019-03-04 MED ORDER — SODIUM CHLORIDE 0.9 % IV SOLN
INTRAVENOUS | Status: AC
  Filled 2019-03-04: qty 2

## 2019-03-04 MED ORDER — HEPARIN (PORCINE) IN NACL 1000-0.9 UT/500ML-% IV SOLN
INTRAVENOUS | Status: DC | PRN
  Administered 2019-03-04: 500 mL

## 2019-03-04 MED ORDER — MIDAZOLAM HCL 5 MG/5ML IJ SOLN
INTRAMUSCULAR | Status: DC | PRN
  Administered 2019-03-04: 1 mg via INTRAVENOUS
  Administered 2019-03-04: 2 mg via INTRAVENOUS
  Administered 2019-03-04: 1 mg via INTRAVENOUS

## 2019-03-04 MED ORDER — FENTANYL CITRATE (PF) 100 MCG/2ML IJ SOLN
INTRAMUSCULAR | Status: DC | PRN
  Administered 2019-03-04 (×2): 12.5 ug via INTRAVENOUS
  Administered 2019-03-04: 25 ug via INTRAVENOUS

## 2019-03-04 SURGICAL SUPPLY — 13 items
CABLE SURGICAL S-101-97-12 (CABLE) ×2 IMPLANT
CATH RIGHTSITE C315HIS02 (CATHETERS) ×4 IMPLANT
COVER DOME SNAP 22 D (MISCELLANEOUS) ×2 IMPLANT
IPG PACE AZUR XT DR MRI W1DR01 (Pacemaker) ×1 IMPLANT
LEAD CAPSURE NOVUS 5076-52CM (Lead) ×2 IMPLANT
LEAD SELECT SECURE 3830 383069 (Lead) ×1 IMPLANT
PACE AZURE XT DR MRI W1DR01 (Pacemaker) ×2 IMPLANT
PAD PRO RADIOLUCENT 2001M-C (PAD) ×2 IMPLANT
SELECT SECURE 3830 383069 (Lead) ×2 IMPLANT
SHEATH CLASSIC 7F (SHEATH) ×4 IMPLANT
SLITTER 6232ADJ (MISCELLANEOUS) ×2 IMPLANT
TRAY PACEMAKER INSERTION (PACKS) ×2 IMPLANT
WIRE HI TORQ VERSACORE-J 145CM (WIRE) ×2 IMPLANT

## 2019-03-04 NOTE — H&P (Signed)
Primary Care Physician: Patient, No Pcp Per Referring Physician:  Dr Gala RomneyBensimhon  Admit Date: 03/02/2019  Reason for consultation:  AV block  Joy Leonard is a 53 y.o. female with a h/o HTN who is admitted with symptomatic AV block.   [facility] with symptoms of fatigue, chest tightness and dizziniess.  Upon arrival she was noted to have complete heart block. In retrospect, she has not had prior episodes.  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 Overnight, she has had intermittent mobitz II second degree AV block, though primarily has had 1:1 conduction of sinus rhythm. Today, she denies symptoms of palpitations, chest pain, shortness of breath, orthopnea, PND, lower extremity edema, dizziness, presyncope, syncope, or neurologic sequela. The patient is tolerating medications without difficulties and is otherwise without complaint today.       Past Medical History:  Diagnosis Date  . Hypertension         Past Surgical History:  Procedure Laterality Date  . CESAREAN SECTION  1990    . amLODipine  10 mg Oral Daily  . aspirin EC  81 mg Oral Daily  . heparin  5,000 Units Subcutaneous Q8H  . multivitamin with minerals  1 tablet Oral Daily       Allergies  Allergen Reactions  . Lisinopril Swelling    Social History        Socioeconomic History  . Marital status: Single    Spouse name: Not on file  . Number of children: Not on file  . Years of education: Not on file  . Highest education level: Not on file  Occupational History  . Not on file  Social Needs  . Financial resource strain: Not hard at all  . Food insecurity:    Worry: Never true    Inability: Never true  . Transportation needs:    Medical: No    Non-medical: No  Tobacco Use  . Smoking status: Never Smoker  . Smokeless tobacco: Never Used  Substance and Sexual Activity  . Alcohol use: Never    Frequency: Never  . Drug use: Never  .  Sexual activity: Not on file  Lifestyle  . Physical activity:    Days per week: 5 days    Minutes per session: 100 min  . Stress: Not at all  Relationships  . Social connections:    Talks on phone: More than three times a week    Gets together: More than three times a week    Attends religious service: More than 4 times per year    Active member of club or organization: No    Attends meetings of clubs or organizations: Never    Relationship status: Divorced  . Intimate partner violence:    Fear of current or ex partner: No    Emotionally abused: No    Physically abused: No    Forced sexual activity: No  Other Topics Concern  . Not on file  Social History Narrative  . Not on file         Family History  Problem Relation Age of Onset  . Hypertension Mother   . Diabetes Mellitus II Mother   . Dementia Father   . Heart disease Other        had pacemaker  her aunt had a pacemaker at an elderly age.  Denies FH of early onset AV block, arrhythmias or CAD.  ROS- All systems are reviewed and negative except as per  the HPI above  Physical Exam: Telemetry: sinus rhythm with 1:1 AV conduction primarily, though occasional mobitz II second degree AV block is observed.       Vitals:   03/03/19 0000 03/03/19 0300 03/03/19 0400 03/03/19 0756  BP: (!) 133/92 (!) 111/97 131/73 (!) 159/84  Pulse: 63 (!) 58 62 76  Resp: 13 12 14 17   Temp:  98.6 F (37 C)  98.7 F (37.1 C)  TempSrc:  Oral  Oral  SpO2: 100% 100% 100% 97%  Weight:      Height:        GEN- The patient is well appearing, alert and oriented x 3 today.   Head- normocephalic, atraumatic Eyes-  Sclera clear, conjunctiva pink Ears- hearing intact Oropharynx- clear Neck- supple, no JVP Lymph- no cervical lymphadenopathy Lungs-  normal work of breathing Heart- Regular rate and rhythm  GI- soft, NT, ND, + BS Extremities- no clubbing, cyanosis, or edema MS- no significant  deformity or atrophy Skin- no rash or lesion Psych- euthymic mood, full affect Neuro- strength and sensation are intact  EKG 03/02/2019 sinus rhythm with complete heart block and RBB escape. ekg 03/03/2019 sinus rhythm with 1:1 conduction, RBBB  Labs:   RecentLabs  Lab Results  Component Value Date   WBC 6.8 03/02/2019   HGB 11.5 (L) 03/02/2019   HCT 36.2 03/02/2019   MCV 66.1 (L) 03/02/2019   PLT 180 03/02/2019      LastLabs     Recent Labs  Lab 03/03/19 0624  NA 140  K 4.0  CL 108  CO2 23  BUN 12  CREATININE 0.70  CALCIUM 9.2  GLUCOSE 99     RecentLabs       Lab Results  Component Value Date   TROPONINI <0.03 03/03/2019      RecentLabs       Lab Results  Component Value Date   CHOL 170 03/03/2019     RecentLabs       Lab Results  Component Value Date   HDL 59 03/03/2019     RecentLabs       Lab Results  Component Value Date   LDLCALC 94 03/03/2019     RecentLabs       Lab Results  Component Value Date   TRIG 84 03/03/2019     RecentLabs       Lab Results  Component Value Date   CHOLHDL 2.9 03/03/2019     RecentLabs  No results found for: LDLDIRECT      Radiology:CXR 03/02/2019- normal  Echo: pending  ASSESSMENT AND PLAN:   1. Mobitz II second degree AV block with intermittent complete heart block The patient has symptomatic AV block.  She has advanced conduction system disease by ekg.  No reversible causes are found.  I would therefore recommend pacemaker implantation.  Echo is pending.  I agree with Dr Gala Romney that given her young age, infiltrative diseases should be excluded with MRI.  I would therefore anticipate PPM tomorrow after MRI is obtained.  Risks, benefits, alternatives to pacemaker implantation were discussed in detail with the patient today. The patient understands that the risks include but are not limited to bleeding, infection, pneumothorax, perforation, tamponade, vascular damage,  renal failure, MI, stroke, death,  and lead dislodgement and wishes to proceed. We will therefore schedule the procedure at the next available time.  I will make NPO after midnight for Dr Ladona Ridgel to proceed tomorrow. No changes currently.  She is stable and primarily 1:1  AV conducting.  2. HTN Stable No change required today   Hillis Range, MD 03/03/2019  10:07 AM  EP Attending  Patient seen and examined. Her MRI is pending. I have discussed the indications/risks/benefits/goals/expectations of PPM insertion and she wishes to proceed.  Leonia Reeves.D.

## 2019-03-04 NOTE — Progress Notes (Signed)
Orthopedic Tech Progress Note Patient Details:  Joy Leonard [birth year] [id number] Patient has on arm sling Patient ID: Joy Leonard, female   DOB: 07-Aug-1966, 53 y.o.   MRN: [id number]   Joy Leonard 03/04/2019, 6:04 PM

## 2019-03-04 NOTE — Discharge Summary (Signed)
ELECTROPHYSIOLOGY PROCEDURE DISCHARGE SUMMARY    Patient ID: Joy Leonard,  MRN: [id number], DOB/AGE: [birth year] 53 y.o.  Admit date: 03/02/2019 Discharge date: 03/05/2019  Primary Care Physician: Patient, No Pcp Per  Electrophysiologist: [facility], Dr. Ladona Ridgelaylor  Primary Discharge Diagnosis:  1. CHB 2. Symptomatic bradycardia  Secondary Discharge Diagnosis:  1. HTN  Allergies  Allergen Reactions   Lisinopril Swelling     Procedures This Admission:  1.  Implantation of a MDT dual chamber PPM on 03/04/2019 by Dr Ladona Ridgelaylor.  The patient received a Medtronic (serial number [id number]) pacemaker, Medtronic [id number] (serial number [id number]) right atrial lead and a Medtronic [id number] (serial number [id number]) right ventricular lead  There were no immediate post procedure complications. 2.  CXR on 03/05/2019 demonstrated no pneumothorax status post device implantation.   Brief HPI: Joy Leonard is a 53 y.o. female with PMHx of HTN only was [facility] she went with c/o upon waking  noticing increasing fatigue and the dizziness with walking, also had a mild chest tightness.  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  She was found in [facility] and [facility]  Hospital Course:  The patient was admitted, not felt to have symptoms to suggest ischemia, her Trop negative.  No reversible causes were found for her advanced heart block and recommended PPM.  TTE noted LVEF >65%, no WMA were described. Given fairly young for advanced conduction disease presentation c/MRI was recommended and found, no myocardial LGE, so no definitive evidence for prior MI, myocarditis, or infiltrative disease.  She underwent implantation of a PPM with details as outlined above.  She was monitored on telemetry overnight which demonstrated SR, V pacing.  Left chest was without hematoma or ecchymosis.  The  device was interrogated and found to be functioning normally.  CXR was obtained and demonstrated no pneumothorax status post device implantation.  Wound care, arm mobility, and restrictions were reviewed with the patient.  The patient  feels well this morning,  She was examined by Dr. Ladona Ridgelaylor and considered stable for discharge to home.   The patient requested a work note.  I wrote to return to work in 1 week if able to work within the activity restrictions.  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  We discussed the importance of following these restrictions.  She will reach out to the office if unable to avoid heavy lifting/vigorous activity at work should she need a longer work restriction.   Physical Exam: Vitals:   03/04/19 1524 03/04/19 1659 03/04/19 2142 03/05/19 0618  BP: 136/89  118/84 134/86  Pulse: 87 89 78 76  Resp:  18 18 20   Temp:   98.4 F (36.9 C) 97.6 F (36.4 C)  TempSrc:   Oral Oral  SpO2: 99% 100% 97% 100%  Weight:    90.4 kg  Height:        GEN- The patient is well appearing, alert and oriented x 3 today.   HEENT: normocephalic, atraumatic; sclera clear, conjunctiva pink; hearing intact; oropharynx clear; neck supple, no JVP Lungs-  CTA b/l, normal work of breathing.  No wheezes, rales, rhonchi Heart-  RRR, no murmurs, rubs or gallops, PMI not laterally displaced GI- soft, non-tender, non-distended Extremities- no clubbing, cyanosis, or edema MS- no significant deformity  or atrophy Skin- warm and dry, no rash or lesion,  left chest without hematoma/ecchymosis Psych- euthymic mood, full affect Neuro- no gross deficits   Labs:   Lab Results  Component Value Date   WBC 6.8 03/02/2019   HGB 11.5 (L) 03/02/2019   HCT 36.2 03/02/2019   MCV 66.1 (L) 03/02/2019   PLT 180 03/02/2019    Recent Labs  Lab 03/03/19 0624  NA 140  K 4.0  CL 108  CO2 23  BUN 12  CREATININE  0.70  CALCIUM 9.2  GLUCOSE 99    Discharge Medications:  Allergies as of 03/05/2019      Reactions   Lisinopril Swelling      Medication List    TAKE these medications   amLODipine 10 MG tablet Commonly known as:  NORVASC Take 1 tablet (10 mg total) by mouth daily.   multivitamin with minerals Tabs tablet Take 1 tablet by mouth daily.       Disposition:  Home  Discharge Instructions    Diet - low sodium heart healthy   Complete by:  As directed    Increase activity slowly   Complete by:  As directed      Follow-up Information    [facility] Office Follow up.   Specialty:  Cardiology Why:  03/18/2019 @ 9:00AM, wound check visit Contact information: [address] [place] [address] [phone]       Marinus Maw, MD Follow up.   Specialty:  Cardiology Why:  05/06/2019 @ 3:00PM Contact information: [address] [place] [address] [phone]           Duration of Discharge Encounter: Greater than 30 minutes including physician time.  Norma Fredrickson, PA-C 03/05/2019 10:58 AM   EP Attending  Patient seen and examined. Agree with above. She is doing well after PPM insertion. PPM interogation under my supervision demonstrates normal device function. She is stable for DC. Usual followup.  Leonia Reeves.D.

## 2019-03-05 ENCOUNTER — Inpatient Hospital Stay (HOSPITAL_COMMUNITY): Payer: Federal, State, Local not specified - PPO

## 2019-03-05 ENCOUNTER — Encounter (HOSPITAL_COMMUNITY): Payer: Self-pay | Admitting: Internal Medicine

## 2019-03-05 DIAGNOSIS — I10 Essential (primary) hypertension: Secondary | ICD-10-CM | POA: Diagnosis not present

## 2019-03-05 DIAGNOSIS — I442 Atrioventricular block, complete: Secondary | ICD-10-CM | POA: Diagnosis not present

## 2019-03-05 DIAGNOSIS — Z950 Presence of cardiac pacemaker: Secondary | ICD-10-CM | POA: Diagnosis not present

## 2019-03-05 DIAGNOSIS — R7303 Prediabetes: Secondary | ICD-10-CM | POA: Diagnosis not present

## 2019-03-05 DIAGNOSIS — I4510 Unspecified right bundle-branch block: Secondary | ICD-10-CM | POA: Diagnosis not present

## 2019-03-05 MED FILL — Lidocaine HCl Local Inj 1%: INTRAMUSCULAR | Qty: 60 | Status: AC

## 2019-03-05 NOTE — Progress Notes (Signed)
Inpatient Diabetes Program Recommendations  AACE/ADA: New Consensus Statement on Inpatient Glycemic Control (2015)  Target Ranges:  Prepandial:   less than 140 mg/dL      Peak postprandial:   less than 180 mg/dL (1-2 hours)      Critically ill patients:  140 - 180 mg/dL   Lab Results  Component Value Date   HGBA1C 6.6 (H) 03/02/2019    Review of Glycemic Control  Complete heart block No DM hx A1c 6.6%  Inpatient Diabetes Program Recommendations:    A1c meets ADA criteria for new DM diagnosis. Patient will need PCP follow up regarding A1c value.  Thanks,  Christena Deem RN, MSN, BC-ADM Inpatient Diabetes Coordinator Team Pager [phone] (8a-5p)

## 2019-03-05 NOTE — Discharge Instructions (Signed)
° °  PLEASE SIGN UP TO YOUR MY CHART ACCOUNT WHEN YOU GET HOME (if you aren't already).  IN THE CURRENT ENVIRONMENT WITH COVID-19, IN EFFORT TO REDUCE YOUR EXPOSURE WE WILL BE CONDUCTING MANY PATIENT VISITS BY EITHER VIRTUAL/VIDEO VISITS or TELEPHONE VISITS.  BEING SIGNED UP IN YOUR MY CHART ACCOUNT  WILL HELP FACILITATE THESE VISITS AND OUR COMMUNICATION WITH YOU ° ° ° ° ° °Supplemental Discharge Instructions for  °Pacemaker/Defibrillator Patients ° °Activity °No heavy lifting or vigorous activity with your left/right arm for 6 to 8 weeks.  Do not raise your left/right arm above your head for one week.  Gradually raise your affected arm as drawn below. ° °        °    03/08/2019                   03/09/2019                  03/10/2019               03/11/2019 °__ ° °NO DRIVING for  1 week  ; you may begin driving on  03/11/2019  . ° °WOUND CARE °- Keep the wound area clean and dry.  Do not get this area wet for one week. No showers for one week; you may shower on 03/11/2019   . °- The tape/steri-strips on your wound will fall off; do not pull them off.  No bandage is needed on the site.  DO  NOT apply any creams, oils, or ointments to the wound area. °- If you notice any drainage or discharge from the wound, any swelling or bruising at the site, or you develop a fever > 101? F after you are discharged home, call the office at once. ° °Special Instructions °- You are still able to use cellular telephones; use the ear opposite the side where you have your pacemaker/defibrillator.  Avoid carrying your cellular phone near your device. °- When traveling through airports, show security personnel your identification card to avoid being screened in the metal detectors.  Ask the security personnel to use the hand wand. °- Avoid arc welding equipment, MRI testing (magnetic resonance imaging), TENS units (transcutaneous nerve stimulators).  Call the office for questions about other devices. °- Avoid electrical appliances that are in  poor condition or are not properly grounded. °- Microwave ovens are safe to be near or to operate. ° ° °

## 2019-03-14 ENCOUNTER — Telehealth: Payer: Self-pay | Admitting: Internal Medicine

## 2019-03-14 NOTE — Telephone Encounter (Signed)
Follow Up:    Pt said she dropped off FMLA paper last week. She wants to know if they are ready please?

## 2019-03-18 ENCOUNTER — Ambulatory Visit (INDEPENDENT_AMBULATORY_CARE_PROVIDER_SITE_OTHER): Payer: Federal, State, Local not specified - PPO | Admitting: *Deleted

## 2019-03-18 ENCOUNTER — Other Ambulatory Visit: Payer: Self-pay

## 2019-03-18 DIAGNOSIS — I442 Atrioventricular block, complete: Secondary | ICD-10-CM | POA: Diagnosis not present

## 2019-03-18 LAB — CUP PACEART INCLINIC DEVICE CHECK
Battery Remaining Longevity: 160 mo
Battery Voltage: 3.22 V
Brady Statistic AP VP Percent: 6.29 %
Brady Statistic AP VS Percent: 2.92 %
Brady Statistic AS VP Percent: 3.91 %
Brady Statistic AS VS Percent: 86.87 %
Brady Statistic RA Percent Paced: 9.22 %
Brady Statistic RV Percent Paced: 10.2 %
Date Time Interrogation Session: 20200518124807
Implantable Lead Implant Date: 20200504
Implantable Lead Implant Date: 20200504
Implantable Lead Location: 753859
Implantable Lead Location: 753860
Implantable Lead Model: 3830
Implantable Lead Model: 5076
Implantable Pulse Generator Implant Date: 20200504
Lead Channel Impedance Value: 304 Ohm
Lead Channel Impedance Value: 399 Ohm
Lead Channel Impedance Value: 437 Ohm
Lead Channel Impedance Value: 665 Ohm
Lead Channel Pacing Threshold Amplitude: 0.5 V
Lead Channel Pacing Threshold Amplitude: 1.25 V
Lead Channel Pacing Threshold Pulse Width: 0.4 ms
Lead Channel Pacing Threshold Pulse Width: 1 ms
Lead Channel Sensing Intrinsic Amplitude: 16.5 mV
Lead Channel Sensing Intrinsic Amplitude: 17.875 mV
Lead Channel Sensing Intrinsic Amplitude: 2 mV
Lead Channel Sensing Intrinsic Amplitude: 3.5 mV
Lead Channel Setting Pacing Amplitude: 3.5 V
Lead Channel Setting Pacing Amplitude: 3.5 V
Lead Channel Setting Pacing Pulse Width: 1 ms
Lead Channel Setting Sensing Sensitivity: 1.2 mV

## 2019-03-18 NOTE — Progress Notes (Signed)
Wound check appointment. Steri-strips removed. Wound without redness or edema. Incision edges approximated, wound well healed. Normal device function. Thresholds, sensing, and impedances consistent with implant measurements. RV capture appeared septal until LOC. Device programmed at 3.5V for extra safety margin until 3 month visit. RV pulse width increased to 1.0 ms, RA/RV high threshold alert turned on and AT/AF daily burden alert turned on. Histogram distribution appropriate for patient and level of activity. 1 AT episode (<0.1%). No high ventricular rate episodes. Patient educated about wound care, arm mobility, lifting restrictions. ROV 06/05/19 with GT.

## 2019-04-09 ENCOUNTER — Telehealth: Payer: Self-pay | Admitting: Internal Medicine

## 2019-04-09 NOTE — Telephone Encounter (Signed)
New message:    Patient needs a note to go back to work. Patient return to work 04/15/19 next Monday. Patient would like to come back to work.

## 2019-04-09 NOTE — Telephone Encounter (Signed)
Letter created.  Pt will come to office tomorrow to pick up.

## 2019-04-17 ENCOUNTER — Ambulatory Visit: Payer: Federal, State, Local not specified - PPO | Admitting: Family Medicine

## 2019-04-17 ENCOUNTER — Other Ambulatory Visit: Payer: Self-pay

## 2019-04-17 VITALS — BP 140/80 | HR 88 | Temp 98.0°F | Wt 211.8 lb

## 2019-04-17 DIAGNOSIS — Z780 Asymptomatic menopausal state: Secondary | ICD-10-CM | POA: Diagnosis not present

## 2019-04-17 DIAGNOSIS — D649 Anemia, unspecified: Secondary | ICD-10-CM

## 2019-04-17 DIAGNOSIS — I10 Essential (primary) hypertension: Secondary | ICD-10-CM

## 2019-04-17 DIAGNOSIS — Z0000 Encounter for general adult medical examination without abnormal findings: Secondary | ICD-10-CM

## 2019-04-17 DIAGNOSIS — E669 Obesity, unspecified: Secondary | ICD-10-CM

## 2019-04-17 DIAGNOSIS — I442 Atrioventricular block, complete: Secondary | ICD-10-CM

## 2019-04-17 DIAGNOSIS — E1169 Type 2 diabetes mellitus with other specified complication: Secondary | ICD-10-CM

## 2019-04-17 MED ORDER — AMLODIPINE BESYLATE 10 MG PO TABS: 10.0000 mg | ORAL_TABLET | Freq: Every day | ORAL | 2 refills | Status: DC

## 2019-04-17 MED ORDER — METFORMIN HCL 500 MG PO TABS: 500.0000 mg | ORAL_TABLET | Freq: Two times a day (BID) | ORAL | 0 refills | Status: DC

## 2019-04-17 NOTE — Progress Notes (Signed)
[facility] Phone: [phone]   cc: establish care  Subjective:  Teodora MediciColette is [facility] to the [facility] service.  She moved here from [place] in February.  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 She went to the emergency department and was found to have a complete heart block.  She had a pacemaker implanted.  She has not had any chest pain since then.  She does not sense any palpitations, no worsening fatigue or SOB.  Monday was her first day back at work since her diagnosis.  In July she has an appointment with her cardiologist.    The patient takes amlodipine for high blood pressure for the past four years.  She was taking aspirin but stopped after the pacemaker was implanted because she did not know if it was okay to resume taking.  She also takes a multivitamin.   Patient has not had a menstrual cycle in over a year.  At age 53 she did not have one for nine months.  She is not experiencing hot flashes currently.    She had a colonoscopy at age 53.  Her last pap smear was a year ago in [place].  She hasn't had a mammogram in almost two years. As far as she knows, all of those tests were normal. She would like a referral for a mammogram.   The patient was noted to be mildly anemic based on blood work from her hospitalization.  She was told she's been anemic since the 1990s, but has never had it worked up.  She is not aware of anyone in her family having anemia.   The patient's last a1c measures was 6.6%, putting her in the diabetic range.  She had not been told she had diabetes before.  She has gained some weight since her diagnosis of heart block due to inactivity and not working.  She has no issues with starting a new medication.    The patient is divorced.  She lives by herself.  She has one son who is 55 years old.  She does not smoke and only drinks alcohol rarely.    ROS: See HPI for pertinent  positives and negatives.   Past Medical History  Family history reviewed for today's visit. No changes.  Social history- patient is a Never smoker.  Rare drinker.    Objective: BP 140/80   Pulse 88   Temp 98 F (36.7 C)   Wt 96.1 kg   SpO2 99%   BMI 36.36 kg/m  Gen: NAD, alert and oriented, cooperative with exam CV: normal rate, regular rhythm. No murmurs, no rubs.  Resp: LCTAB, no wheezes, crackles. normal work of breathing GI: nontender to palpation, BS present, no guarding or organomegaly Neuro: CN II-XII grossly intact. no gross deficits Skin: No rashes, no lesions Psych: Appropriate behavior  Assessment/Plan: Complete heart block (HCC) Currently asymptomatic. Sees cardiology.  Has another appointment in July.  Has pacemaker.   Hypertension Takes 10mg  amlodipine daily.  Well controlled.  140/80 today.   Menopause Has not had menstrual cycle in over a year.  Previously went 9 months without one.  No other symptoms currently.  - no intervention. Continue to monitor.   Anemia This has apparently been a known issue for as long as 30 years, but patient unaware of any workup or diagnosis.  MCV's consistently show mild anemia with significantly low MCVs in  the 60s.  No known family hx of anemia.  This sounds most consistent with beta thalassemia minor.  Will obtain iron studies and cbc and if iron deficiency anemia excluded will work up further the cause of anemia.   - cbc - iron, ferritin, TIBC - if necessary, consider peripheral smear, unconjugated bilirubin, LDH, haptoglobin and confirmation with hemoglobin analysis.   Healthcare maintenance Pt is up to date on colonoscopy and pap smear, though these were done in [place] and will need to obtain her records.   - referral to mammogram - obtain records from [place] .   Diabetes mellitus type 2 in obese (HCC) Last a1c was 6.6%.  Discussed with pt the idea of starting metformin.  She is okay with it.  Discussed possible  side effects.   - started metformin 500mg  qdaily - educated pt on benefits of diet and exercise in diabetes.   - repeat A1c 3 months after previous a1c.    Clemetine Marker, MD PGY-1

## 2019-04-17 NOTE — Patient Instructions (Addendum)
it was nice to meet you today.  I have sent in a referral for mammogram, you can also call number listed on that card we gave you.  I have refilled your amlodipine.  I have started you on metformin.  Please start by taking 1 tablet in the morning every day and after 1 week or if you are able to tolerate it without any side effects you can start taking a second tablet in the afternoon with dinner.  I have done some lab work to work-up the cause of your anemia.  If it turns out to be just iron deficiency we will not need to do any further work-up and you can just start taking iron.  If it does not appear to be from iron deficiency we might need to do further lab work.  I would like you to come back in 3 months if not sooner So that we can check your blood sugar again and follow-up on the results of your anemia lab work.  Have a great day  Clemetine Marker, MD

## 2019-04-18 LAB — CBC
Hematocrit: 34.5 % (ref 34.0–46.6)
Hemoglobin: 10.7 g/dL — ABNORMAL LOW (ref 11.1–15.9)
MCH: 21.1 pg — ABNORMAL LOW (ref 26.6–33.0)
MCHC: 31 g/dL — ABNORMAL LOW (ref 31.5–35.7)
MCV: 68 fL — ABNORMAL LOW (ref 79–97)
Platelets: 232 10*3/uL (ref 150–450)
RBC: 5.06 x10E6/uL (ref 3.77–5.28)
RDW: 18.4 % — ABNORMAL HIGH (ref 11.7–15.4)
WBC: 5.2 10*3/uL (ref 3.4–10.8)

## 2019-04-18 LAB — IRON,TIBC AND FERRITIN PANEL
Ferritin: 145 ng/mL (ref 15–150)
Iron Saturation: 13 % — ABNORMAL LOW (ref 15–55)
Iron: 41 ug/dL (ref 27–159)
Total Iron Binding Capacity: 309 ug/dL (ref 250–450)
UIBC: 268 ug/dL (ref 131–425)

## 2019-04-20 ENCOUNTER — Encounter: Payer: Self-pay | Admitting: Family Medicine

## 2019-04-20 ENCOUNTER — Other Ambulatory Visit: Payer: Self-pay | Admitting: Family Medicine

## 2019-04-20 DIAGNOSIS — R7303 Prediabetes: Secondary | ICD-10-CM | POA: Insufficient documentation

## 2019-04-20 DIAGNOSIS — E1169 Type 2 diabetes mellitus with other specified complication: Secondary | ICD-10-CM | POA: Insufficient documentation

## 2019-04-20 DIAGNOSIS — I10 Essential (primary) hypertension: Secondary | ICD-10-CM | POA: Insufficient documentation

## 2019-04-20 DIAGNOSIS — Z0000 Encounter for general adult medical examination without abnormal findings: Secondary | ICD-10-CM | POA: Insufficient documentation

## 2019-04-20 DIAGNOSIS — D563 Thalassemia minor: Secondary | ICD-10-CM | POA: Insufficient documentation

## 2019-04-20 DIAGNOSIS — D649 Anemia, unspecified: Secondary | ICD-10-CM

## 2019-04-20 DIAGNOSIS — Z780 Asymptomatic menopausal state: Secondary | ICD-10-CM | POA: Insufficient documentation

## 2019-04-20 MED ORDER — METFORMIN HCL 500 MG PO TABS: 500.0000 mg | ORAL_TABLET | Freq: Every day | ORAL | 0 refills | Status: DC

## 2019-04-20 NOTE — Progress Notes (Signed)
Spoke with pt on phone about her results.  Normal iron and ferritin levels more convincing for thalassemia.  Ordering additional labs to confirm thalassemia including ldh, haptoglobin, reticulocytes, hemoglobin electrophoresis.  Marland Kitchen

## 2019-04-20 NOTE — Assessment & Plan Note (Signed)
Pt is up to date on colonoscopy and pap smear, though these were done in [place] and will need to obtain her records.   - referral to mammogram - obtain records from [place] .

## 2019-04-20 NOTE — Assessment & Plan Note (Signed)
Takes 10mg  amlodipine daily.  Well controlled.  140/80 today.

## 2019-04-20 NOTE — Assessment & Plan Note (Addendum)
Currently asymptomatic. Sees cardiology.  Has another appointment in July.  Has pacemaker.

## 2019-04-20 NOTE — Assessment & Plan Note (Signed)
This has apparently been a known issue for as long as 30 years, but patient unaware of any workup or diagnosis.  MCV's consistently show mild anemia with significantly low MCVs in the 60s.  No known family hx of anemia.  This sounds most consistent with beta thalassemia minor.  Will obtain iron studies and cbc and if iron deficiency anemia excluded will work up further the cause of anemia.   - cbc - iron, ferritin, TIBC - if necessary, consider peripheral smear, unconjugated bilirubin, LDH, haptoglobin and confirmation with hemoglobin analysis.

## 2019-04-20 NOTE — Assessment & Plan Note (Addendum)
Last a1c was 6.6%.  Discussed with pt the idea of starting metformin.  She is okay with it.  Discussed possible side effects.   - started metformin 500mg  qdaily - educated pt on benefits of diet and exercise in diabetes.   - repeat A1c 3 months after previous a1c.

## 2019-04-20 NOTE — Assessment & Plan Note (Signed)
Has not had menstrual cycle in over a year.  Previously went 9 months without one.  No other symptoms currently.  - no intervention. Continue to monitor.

## 2019-04-22 ENCOUNTER — Other Ambulatory Visit: Payer: Self-pay

## 2019-04-22 ENCOUNTER — Ambulatory Visit
Admission: RE | Admit: 2019-04-22 | Discharge: 2019-04-22 | Disposition: A | Discharge status: Home / Self Care | Payer: Federal, State, Local not specified - PPO | Source: Ambulatory Visit | Attending: Family Medicine | Admitting: Family Medicine

## 2019-04-22 ENCOUNTER — Other Ambulatory Visit: Payer: Federal, State, Local not specified - PPO

## 2019-04-22 DIAGNOSIS — D649 Anemia, unspecified: Secondary | ICD-10-CM | POA: Diagnosis not present

## 2019-04-22 DIAGNOSIS — Z1231 Encounter for screening mammogram for malignant neoplasm of breast: Secondary | ICD-10-CM | POA: Diagnosis not present

## 2019-04-23 ENCOUNTER — Encounter (HOSPITAL_COMMUNITY): Payer: Self-pay | Admitting: Emergency Medicine

## 2019-04-23 ENCOUNTER — Other Ambulatory Visit: Payer: Self-pay

## 2019-04-23 ENCOUNTER — Ambulatory Visit (HOSPITAL_COMMUNITY)
Admission: EM | Admit: 2019-04-23 | Discharge: 2019-04-23 | Disposition: A | Discharge status: Home / Self Care | Payer: Federal, State, Local not specified - PPO | Attending: Family Medicine | Admitting: Family Medicine

## 2019-04-23 DIAGNOSIS — H00015 Hordeolum externum left lower eyelid: Secondary | ICD-10-CM

## 2019-04-23 MED ORDER — POLYMYXIN B-TRIMETHOPRIM 10000-0.1 UNIT/ML-% OP SOLN: 1.0000 [drp] | OPHTHALMIC | 0 refills | Status: DC

## 2019-04-23 NOTE — Discharge Instructions (Addendum)
Use the eye drops as prescribed Warm compresses for 10 minutes at a time 3-4 times a day.  Follow up as needed for continued or worsening symptoms

## 2019-04-23 NOTE — ED Triage Notes (Signed)
Pt here for left eye swelling x 3 days

## 2019-04-23 NOTE — ED Provider Notes (Signed)
[place]    CSN: [id number] Arrival date & time: 04/23/19  1151     History   Chief Complaint Chief Complaint  Patient presents with  . Facial Swelling    HPI Joy Leonard is a 53 y.o. female.   Pt is a 53 year old female with past medical history of hypertension, anemia, diabetes type 2, complete heart block.  She presents with approximate 3 days of left lower lid swelling, tenderness.  Symptoms have been constant.  She has not done anything to treat the symptoms.  She denies any fevers, drainage from the eye.  Denies any blurred vision, eye pain, foreign bodies, injury to the eye.  ROS per HPI      Past Medical History:  Diagnosis Date  . Hypertension     Patient Active Problem List   Diagnosis Date Noted  . Hypertension 04/20/2019  . Menopause 04/20/2019  . Anemia 04/20/2019  . Healthcare maintenance 04/20/2019  . Diabetes mellitus type 2 in obese ([place]) 04/20/2019  . Complete heart block ([place]) 03/02/2019    Past Surgical History:  Procedure Laterality Date  . CESAREAN SECTION  1990  . PACEMAKER IMPLANT N/A 03/04/2019   Procedure: PACEMAKER IMPLANT;  Surgeon: Evans Lance, MD;  Location: [facility];  Service: Cardiovascular;  Laterality: N/A;    OB History   No obstetric history on file.      Home Medications    Prior to Admission medications   Medication Sig Start Date End Date Taking? Authorizing Provider  amLODipine (NORVASC) 10 MG tablet Take 1 tablet (10 mg total) by mouth daily. 04/17/19   Benay Pike, MD  metFORMIN (GLUCOPHAGE) 500 MG tablet Take 1 tablet (500 mg total) by mouth daily with breakfast. 04/20/19   Benay Pike, MD  Multiple Vitamin (MULTIVITAMIN WITH MINERALS) TABS tablet Take 1 tablet by mouth daily.    [provider]  trimethoprim-polymyxin b (POLYTRIM) ophthalmic solution Place 1 drop into the left eye every 4 (four) hours. 04/23/19   Orvan July, NP    Family History Family  History  Problem Relation Age of Onset  . Hypertension Mother   . Diabetes Mellitus II Mother   . Dementia Father   . Heart disease Other        had pacemaker    Social History Social History   Tobacco Use  . Smoking status: Never Smoker  . Smokeless tobacco: Never Used  Substance Use Topics  . Alcohol use: Never    Frequency: Never  . Drug use: Never     Allergies   Lisinopril   Review of Systems Review of Systems   Physical Exam Triage Vital Signs ED Triage Vitals [04/23/19 1207]  Enc Vitals Group     BP (!) 149/93     Pulse Rate 83     Resp 18     Temp 98 F (36.7 C)     Temp Source Oral     SpO2 100 %     Weight      Height      Head Circumference      Peak Flow      Pain Score 2     Pain Loc      Pain Edu?      Excl. in [place]?    No data found.  Updated Vital Signs BP (!) 149/93 (BP Location: Left Arm)   Pulse 83   Temp 98 F (36.7 C) (Oral)  Resp 18   SpO2 100%   Visual Acuity Right Eye Distance:   Left Eye Distance:   Bilateral Distance:    Right Eye Near:   Left Eye Near:    Bilateral Near:     Physical Exam Vitals signs and nursing note reviewed.  Constitutional:      General: She is not in acute distress.    Appearance: Normal appearance. She is not ill-appearing, toxic-appearing or diaphoretic.  HENT:     Head: Normocephalic and atraumatic.     Nose: Nose normal.     Mouth/Throat:     Pharynx: Oropharynx is clear.  Eyes:     General: No allergic shiner or scleral icterus.       Left eye: Hordeolum present.    Extraocular Movements: Extraocular movements intact.     Right eye: Normal extraocular motion and no nystagmus.     Left eye: Normal extraocular motion and no nystagmus.     Conjunctiva/sclera:     Left eye: Left conjunctiva is injected.     Pupils: Pupils are equal, round, and reactive to light.  Neurological:     Mental Status: She is alert.      UC Treatments / Results  Labs (all labs ordered are listed,  but only abnormal results are displayed) Labs Reviewed - No data to display  EKG None  Radiology No results found.  Procedures Procedures (including critical care time)  Medications Ordered in UC Medications - No data to display  Initial Impression / Assessment and Plan / UC Course  I have reviewed the triage vital signs and the nursing notes.  Pertinent labs & imaging results that were available during my care of the patient were reviewed by me and considered in my medical decision making (see chart for details).     Stye to left lower lid with scleral injection No drainage.  mildly tender  Will have pt due warm compresses and use eye drops as prescribed.  Follow up as needed for continued or worsening symptoms  Final Clinical Impressions(s) / UC Diagnoses   Final diagnoses:  Hordeolum externum of left lower eyelid     Discharge Instructions     Use the eye drops as prescribed Warm compresses for 10 minutes at a time 3-4 times a day.  Follow up as needed for continued or worsening symptoms     ED Prescriptions    Medication Sig Dispense Auth. Provider   trimethoprim-polymyxin b (POLYTRIM) ophthalmic solution Place 1 drop into the left eye every 4 (four) hours. 10 mL Dahlia ByesBast, Brelee Renk A, NP     Controlled Substance Prescriptions [place] Controlled Substance Registry consulted? Not Applicable   Janace ArisBast, Colonel Krauser A, NP 04/23/19 1223

## 2019-04-24 LAB — HEMOGLOBINOPATHY EVALUATION

## 2019-04-30 ENCOUNTER — Other Ambulatory Visit: Payer: Self-pay | Admitting: Family Medicine

## 2019-04-30 DIAGNOSIS — R928 Other abnormal and inconclusive findings on diagnostic imaging of breast: Secondary | ICD-10-CM

## 2019-05-01 LAB — HEMOGLOBINOPATHY EVALUATION
Ferritin: 109 ng/mL (ref 15–150)
Hematocrit: 34.9 % (ref 34.0–46.6)
Hemoglobin: 10.8 g/dL — ABNORMAL LOW (ref 11.1–15.9)
Hgb A2 Quant: 1.8 % (ref 1.8–3.2)
Hgb A: 98.2 % (ref 96.4–98.8)
Hgb C: 0 %
Hgb F Quant: 0 % (ref 0.0–2.0)
Hgb S: 0 %
Hgb Solubility: NEGATIVE
Hgb Variant: 0 %
MCH: 21.1 pg — ABNORMAL LOW (ref 26.6–33.0)
MCHC: 30.9 g/dL — ABNORMAL LOW (ref 31.5–35.7)
MCV: 68 fL — ABNORMAL LOW (ref 79–97)
Platelets: 263 10*3/uL (ref 150–450)
RBC: 5.12 x10E6/uL (ref 3.77–5.28)
RDW: 18.8 % — ABNORMAL HIGH (ref 11.7–15.4)
WBC: 4.9 10*3/uL (ref 3.4–10.8)

## 2019-05-01 LAB — HAPTOGLOBIN: Haptoglobin: 173 mg/dL (ref 33–346)

## 2019-05-01 LAB — ALPHA-THALASSEMIA

## 2019-05-01 LAB — LACTATE DEHYDROGENASE: LDH: 173 IU/L (ref 119–226)

## 2019-05-06 ENCOUNTER — Ambulatory Visit
Admission: RE | Admit: 2019-05-06 | Discharge: 2019-05-06 | Disposition: A | Discharge status: Home / Self Care | Payer: Federal, State, Local not specified - PPO | Source: Ambulatory Visit | Attending: Family Medicine | Admitting: Family Medicine

## 2019-05-06 ENCOUNTER — Encounter: Payer: Federal, State, Local not specified - PPO | Admitting: Internal Medicine

## 2019-05-06 ENCOUNTER — Ambulatory Visit (INDEPENDENT_AMBULATORY_CARE_PROVIDER_SITE_OTHER): Payer: Federal, State, Local not specified - PPO | Admitting: *Deleted

## 2019-05-06 ENCOUNTER — Other Ambulatory Visit: Payer: Self-pay

## 2019-05-06 DIAGNOSIS — R922 Inconclusive mammogram: Secondary | ICD-10-CM | POA: Diagnosis not present

## 2019-05-06 DIAGNOSIS — I442 Atrioventricular block, complete: Secondary | ICD-10-CM

## 2019-05-06 DIAGNOSIS — R928 Other abnormal and inconclusive findings on diagnostic imaging of breast: Secondary | ICD-10-CM

## 2019-05-06 DIAGNOSIS — N6001 Solitary cyst of right breast: Secondary | ICD-10-CM | POA: Diagnosis not present

## 2019-05-06 LAB — CUP PACEART REMOTE DEVICE CHECK
Battery Remaining Longevity: 169 mo
Battery Voltage: 3.21 V
Brady Statistic AP VP Percent: 3.53 %
Brady Statistic AP VS Percent: 3.89 %
Brady Statistic AS VP Percent: 0.03 %
Brady Statistic AS VS Percent: 92.56 %
Brady Statistic RA Percent Paced: 7.47 %
Brady Statistic RV Percent Paced: 3.55 %
Date Time Interrogation Session: 20200706061619
Implantable Lead Implant Date: 20200504
Implantable Lead Implant Date: 20200504
Implantable Lead Location: 753859
Implantable Lead Location: 753860
Implantable Lead Model: 3830
Implantable Lead Model: 5076
Implantable Pulse Generator Implant Date: 20200504
Lead Channel Impedance Value: 266 Ohm
Lead Channel Impedance Value: 399 Ohm
Lead Channel Impedance Value: 418 Ohm
Lead Channel Impedance Value: 551 Ohm
Lead Channel Pacing Threshold Amplitude: 0.625 V
Lead Channel Pacing Threshold Amplitude: 1.125 V
Lead Channel Pacing Threshold Pulse Width: 0.4 ms
Lead Channel Pacing Threshold Pulse Width: 0.4 ms
Lead Channel Sensing Intrinsic Amplitude: 14.875 mV
Lead Channel Sensing Intrinsic Amplitude: 14.875 mV
Lead Channel Sensing Intrinsic Amplitude: 2.625 mV
Lead Channel Sensing Intrinsic Amplitude: 2.625 mV
Lead Channel Setting Pacing Amplitude: 3.5 V
Lead Channel Setting Pacing Amplitude: 3.5 V
Lead Channel Setting Pacing Pulse Width: 1 ms
Lead Channel Setting Sensing Sensitivity: 1.2 mV

## 2019-05-12 ENCOUNTER — Encounter: Payer: Self-pay | Admitting: Cardiology

## 2019-05-12 NOTE — Progress Notes (Signed)
Remote pacemaker transmission.   

## 2019-05-23 ENCOUNTER — Other Ambulatory Visit: Payer: Self-pay | Admitting: *Deleted

## 2019-05-24 MED ORDER — METFORMIN HCL 500 MG PO TABS: 500.0000 mg | ORAL_TABLET | Freq: Every day | ORAL | 0 refills | Status: DC

## 2019-06-05 ENCOUNTER — Other Ambulatory Visit: Payer: Self-pay

## 2019-06-05 ENCOUNTER — Ambulatory Visit: Payer: Federal, State, Local not specified - PPO | Admitting: Internal Medicine

## 2019-06-05 ENCOUNTER — Encounter: Payer: Self-pay | Admitting: Internal Medicine

## 2019-06-05 VITALS — BP 140/86 | HR 71 | Ht 64.0 in | Wt 203.0 lb

## 2019-06-05 DIAGNOSIS — Z950 Presence of cardiac pacemaker: Secondary | ICD-10-CM | POA: Insufficient documentation

## 2019-06-05 DIAGNOSIS — I442 Atrioventricular block, complete: Secondary | ICD-10-CM

## 2019-06-05 DIAGNOSIS — I10 Essential (primary) hypertension: Secondary | ICD-10-CM | POA: Diagnosis not present

## 2019-06-05 NOTE — Patient Instructions (Addendum)
Medication Instructions:  Your physician recommends that you continue on your current medications as directed. Please refer to the Current Medication list given to you today.  Labwork: None ordered.  Testing/Procedures: None ordered.  Follow-Up: Your physician wants you to follow-up in: 9 months with Dr. Taylor.  You will receive a reminder letter in the mail two months in advance. If you don't receive a letter, please call our office to schedule the follow-up appointment.  Remote monitoring is used to monitor your Pacemaker from home. This monitoring reduces the number of office visits required to check your device to one time per year. It allows us to keep an eye on the functioning of your device to ensure it is working properly. You are scheduled for a device check from home on 09/02/2019. You may send your transmission at any time that day. If you have a wireless device, the transmission will be sent automatically. After your physician reviews your transmission, you will receive a postcard with your next transmission date.  Any Other Special Instructions Will Be Listed Below (If Applicable).  If you need a refill on your cardiac medications before your next appointment, please call your pharmacy.   

## 2019-06-05 NOTE — Progress Notes (Signed)
HPI Joy Leonard returns today for followup. She is a pleasant middle aged woman with a h/o HTN, DM, and obesity who presented in may with bradycardia due to Mobitz 2, second degree AV block. She underwent a medtronic DDD PM insertion. She has done well in the interim. She denies chest pain, sob, or syncope. She is back working no edema.  Allergies  Allergen Reactions  . Lisinopril Swelling     Current Outpatient Medications  Medication Sig Dispense Refill  . amLODipine (NORVASC) 10 MG tablet Take 1 tablet (10 mg total) by mouth daily. 90 tablet 2  . metFORMIN (GLUCOPHAGE) 500 MG tablet Take 1 tablet (500 mg total) by mouth daily with breakfast. (Patient taking differently: Take 500 mg by mouth 2 (two) times daily with a meal. ) 60 tablet 0  . Multiple Vitamin (MULTIVITAMIN WITH MINERALS) TABS tablet Take 1 tablet by mouth daily.    Marland Kitchen trimethoprim-polymyxin b (POLYTRIM) ophthalmic solution Place 1 drop into the left eye every 4 (four) hours. 10 mL 0   No current facility-administered medications for this visit.      Past Medical History:  Diagnosis Date  . Hypertension     ROS:   All systems reviewed and negative except as noted in the HPI.   Past Surgical History:  Procedure Laterality Date  . CESAREAN SECTION  1990  . PACEMAKER IMPLANT N/A 03/04/2019   Procedure: PACEMAKER IMPLANT;  Surgeon: Evans Lance, MD;  Location: [facility];  Service: Cardiovascular;  Laterality: N/A;     Family History  Problem Relation Age of Onset  . Hypertension Mother   . Diabetes Mellitus II Mother   . Dementia Father   . Heart disease Other        had pacemaker     Social History   Socioeconomic History  . Marital status: Single    Spouse name: Not on file  . Number of children: Not on file  . Years of education: Not on file  . Highest education level: Not on file  Occupational History  . Not on file  Social Needs  . Financial resource strain: Not hard at  all  . Food insecurity    Worry: Never true    Inability: Never true  . Transportation needs    Medical: No    Non-medical: No  Tobacco Use  . Smoking status: Never Smoker  . Smokeless tobacco: Never Used  Substance and Sexual Activity  . Alcohol use: Never    Frequency: Never  . Drug use: Never  . Sexual activity: Not on file  Lifestyle  . Physical activity    Days per week: 5 days    Minutes per session: 100 min  . Stress: Not at all  Relationships  . Social connections    Talks on phone: More than three times a week    Gets together: More than three times a week    Attends religious service: More than 4 times per year    Active member of club or organization: No    Attends meetings of clubs or organizations: Never    Relationship status: Divorced  . Intimate partner violence    Fear of current or ex partner: No    Emotionally abused: No    Physically abused: No    Forced sexual activity: No  Other Topics Concern  . Not on file  Social History Narrative  . Not on file  BP 140/86   Pulse 71   Ht 5\' 4"  (1.626 m)   Wt 203 lb (92.1 kg)   SpO2 98%   BMI 34.84 kg/m   Physical Exam:  Well appearing NAD HEENT: Unremarkable Neck:  No JVD, no thyromegally Lymphatics:  No adenopathy Back:  No CVA tenderness Lungs:  Clear with no wheezes HEART:  Regular rate rhythm, no murmurs, no rubs, no clicks Abd:  soft, positive bowel sounds, no organomegally, no rebound, no guarding Ext:  2 plus pulses, no edema, no cyanosis, no clubbing Skin:  No rashes no nodules Neuro:  CN II through XII intact, motor grossly intact  EKG - none  DEVICE  Normal device function.  See PaceArt for details.   Assess/Plan: 1. Mobitz 2 second degree heart block - her conduction has improved. She is asymptomatic and will undergo watchful waiting. 2. HTN - her pressure is up a little today. She is encouraged to reduce her salt intake and to lose weight. 3. PPM - her medtronic DDD PM is  working normally. Her His bundle lead is septal.  4. Atrial tachycardia - she has a long RP tachycardia lasting up to 2 minutes. She has been asymptomatic. She will call us if she has symptoms from this.   Leonia ReevesGregg Aashna Matson,M.D.

## 2019-06-06 ENCOUNTER — Other Ambulatory Visit: Payer: Self-pay

## 2019-06-14 LAB — CUP PACEART INCLINIC DEVICE CHECK
Battery Remaining Longevity: 174 mo
Battery Voltage: 3.2 V
Brady Statistic AP VP Percent: 4.92 %
Brady Statistic AP VS Percent: 5.42 %
Brady Statistic AS VP Percent: 0.03 %
Brady Statistic AS VS Percent: 89.63 %
Brady Statistic RA Percent Paced: 10.39 %
Brady Statistic RV Percent Paced: 4.95 %
Date Time Interrogation Session: 20200805153400
Implantable Lead Implant Date: 20200504
Implantable Lead Implant Date: 20200504
Implantable Lead Location: 753859
Implantable Lead Location: 753860
Implantable Lead Model: 3830
Implantable Lead Model: 5076
Implantable Pulse Generator Implant Date: 20200504
Lead Channel Impedance Value: 285 Ohm
Lead Channel Impedance Value: 399 Ohm
Lead Channel Impedance Value: 532 Ohm
Lead Channel Impedance Value: 589 Ohm
Lead Channel Pacing Threshold Amplitude: 0.875 V
Lead Channel Pacing Threshold Amplitude: 1.25 V
Lead Channel Pacing Threshold Pulse Width: 0.4 ms
Lead Channel Pacing Threshold Pulse Width: 0.4 ms
Lead Channel Sensing Intrinsic Amplitude: 0.25 mV
Lead Channel Sensing Intrinsic Amplitude: 16.375 mV
Lead Channel Sensing Intrinsic Amplitude: 18.5 mV
Lead Channel Sensing Intrinsic Amplitude: 4.375 mV
Lead Channel Setting Pacing Amplitude: 2 V
Lead Channel Setting Pacing Amplitude: 2.5 V
Lead Channel Setting Pacing Pulse Width: 1 ms
Lead Channel Setting Sensing Sensitivity: 1.2 mV

## 2019-07-03 ENCOUNTER — Telehealth: Payer: Self-pay | Admitting: Internal Medicine

## 2019-07-03 NOTE — Telephone Encounter (Signed)
Returned call to Pt.  Per Pt on Monday she started having increasing SOB, especially when walking.  Pt works for [employer] and called out yesterday thinking she was maybe just "tired".  States since Monday she's had a "choking" sensation in her throat.  When she walks she breathes very hard and is "exhausted".  She states since Monday she has been more aware of her heart/chest.    BP 149/90 pulse 69  Plan:  1.  Pt is submitting remote check right now  2.  If remote is normal-would advise have Pt see APP  3.  If remote abnormal-treat accordingly  Pt indicates understanding of plan and agreement.  Pt states she is ok to wait for follow up until tomorrow morning (advised that is when we would have results of remote check).  Will forward to device clinic to monitor for remote.

## 2019-07-03 NOTE — Telephone Encounter (Signed)
Call placed to Pt.  Advised her remote did show a change in her pacemaker function.  Advised would make outreach to GT to see if he advises making any pacemaker adjustments.  Advised would call Pt in the AM and if any changes suggested would work her in in [place].  Pt thanked nurse for call.

## 2019-07-03 NOTE — Telephone Encounter (Signed)
° ° °  Pt c/o Shortness Of Breath: STAT if SOB developed within the last 24 hours or pt is noticeably SOB on the phone  1. Are you currently SOB (can you hear that pt is SOB on the phone)? no  2. How long have you been experiencing SOB? 2 days  3. Are you SOB when sitting or when up moving around? When walking  4. Are you currently experiencing any other symptoms? SOB when walking

## 2019-07-03 NOTE — Telephone Encounter (Signed)
Transmission received. Presenting rhythm As/Vp @ 98bpm. Lead trends stable. 71 AT/AF episodes (0.4% burden)--EGMs show AT, some competitive atrial pacing and FFRWs also noted (similar to episodes reviewed at 06/05/19 OV). V-pacing % has markedly increased in recent days, nearing 100%, implanted for Mobitz II HB, PAV/SAV programmed at 280ms.

## 2019-07-04 ENCOUNTER — Encounter: Payer: Self-pay | Admitting: *Deleted

## 2019-07-04 ENCOUNTER — Other Ambulatory Visit: Payer: Self-pay

## 2019-07-04 ENCOUNTER — Ambulatory Visit (INDEPENDENT_AMBULATORY_CARE_PROVIDER_SITE_OTHER): Payer: Federal, State, Local not specified - PPO | Admitting: *Deleted

## 2019-07-04 DIAGNOSIS — I442 Atrioventricular block, complete: Secondary | ICD-10-CM

## 2019-07-04 DIAGNOSIS — Z950 Presence of cardiac pacemaker: Secondary | ICD-10-CM | POA: Diagnosis not present

## 2019-07-04 NOTE — Telephone Encounter (Signed)
Spoke with Dr. Lovena Le. Recommendations: try reprogramming AV delays to encourage A-V conduction. If no conduction, fix AV delays at 16ms/180ms. If symptoms are not resolved, order 2D echo, f/u with Dr. Lovena Le after echo.  Spoke with patient. She is agreeable to an appointment today at 12:00pm for reprogramming.      COVID-19 Pre-Screening Questions:  . In the past 7 to 10 days have you had a cough,  shortness of breath, headache, congestion, fever (100 or greater) body aches, chills, sore throat, or sudden loss of taste or sense of smell? no . Have you been around anyone with known Covid 19? no . Have you been around anyone who is awaiting Covid 19 test results in the past 7 to 10 days? no . Have you been around anyone who has been exposed to Covid 19, or has mentioned symptoms of Covid 19 within the past 7 to 10 days? no

## 2019-07-04 NOTE — Patient Instructions (Signed)
Today your pacemaker timing was reprogrammed. This will hopefully minimize the sensation you have been feeling in your throat, and improve your fatigue when walking.  Please call [phone] and update Sonia Baller, RN, or the [facility] if your symptoms are not improved. Per Dr. Lovena Le, the next step at that point will be to schedule an echocardiogram (ultrasound) of you heart to determine if your heart muscle pumping function has changed.

## 2019-07-04 NOTE — Telephone Encounter (Signed)
I spoke with the pt and let her know we are waiting on Dr. Tanna Furry recommendations then someone will give her a call back.

## 2019-07-10 LAB — CUP PACEART INCLINIC DEVICE CHECK
Battery Remaining Longevity: 169 mo
Battery Voltage: 3.18 V
Brady Statistic AP VP Percent: 8.79 %
Brady Statistic AP VS Percent: 3.84 %
Brady Statistic AS VP Percent: 13.61 %
Brady Statistic AS VS Percent: 73.76 %
Brady Statistic RA Percent Paced: 12.76 %
Brady Statistic RV Percent Paced: 22.77 %
Date Time Interrogation Session: 20200903161754
Implantable Lead Implant Date: 20200504
Implantable Lead Implant Date: 20200504
Implantable Lead Location: 753859
Implantable Lead Location: 753860
Implantable Lead Model: 3830
Implantable Lead Model: 5076
Implantable Pulse Generator Implant Date: 20200504
Lead Channel Impedance Value: 285 Ohm
Lead Channel Impedance Value: 399 Ohm
Lead Channel Impedance Value: 418 Ohm
Lead Channel Impedance Value: 589 Ohm
Lead Channel Pacing Threshold Amplitude: 0.5 V
Lead Channel Pacing Threshold Amplitude: 0.75 V
Lead Channel Pacing Threshold Pulse Width: 0.4 ms
Lead Channel Pacing Threshold Pulse Width: 1 ms
Lead Channel Sensing Intrinsic Amplitude: 23.25 mV
Lead Channel Sensing Intrinsic Amplitude: 4.5 mV
Lead Channel Setting Pacing Amplitude: 1.5 V
Lead Channel Setting Pacing Amplitude: 2.5 V
Lead Channel Setting Pacing Pulse Width: 1 ms
Lead Channel Setting Sensing Sensitivity: 1.2 mV

## 2019-07-10 NOTE — Progress Notes (Signed)
Pacemaker check in clinic, added-on for increased V-pacing correlating with sensation of heart beating in patient's throat. Normal device function. Thresholds, sensing, impedances consistent with previous measurements. Device programmed to maximize longevity. 77 AT/AF episodes--all available EGMs show FFRWs, PVAB already programmed Partial +, decreased RA sensitivity to 0.8mV, no further FFRWs noted during visit. No high ventricular rates noted. Device programmed at appropriate safety margins. Histogram distribution appropriate for patient activity level. VP increased to ~100% over recent days, no longer conducting 1:1. Per Dr. Lovena Le, reprogrammed SAV/PAV to 158ms/180ms, patient walked in office, no longer noticing sensation of heart beating in her throat. Estimated longevity 9.9 years. Patient enrolled in remote follow-up. Patient education completed. Per Dr. Lovena Le, plan to obtain echo if patient's symptoms are not improved with this change (see phone note from 07/03/19). [facility] on 09/02/19 and ROV with Dr. Lovena Le in 02/2020.

## 2019-07-23 ENCOUNTER — Ambulatory Visit: Payer: Federal, State, Local not specified - PPO | Admitting: Family Medicine

## 2019-07-29 ENCOUNTER — Other Ambulatory Visit: Payer: Self-pay

## 2019-07-29 ENCOUNTER — Ambulatory Visit (INDEPENDENT_AMBULATORY_CARE_PROVIDER_SITE_OTHER): Payer: Federal, State, Local not specified - PPO | Admitting: Family Medicine

## 2019-07-29 ENCOUNTER — Encounter: Payer: Self-pay | Admitting: Family Medicine

## 2019-07-29 VITALS — BP 140/88 | HR 68 | Wt 205.6 lb

## 2019-07-29 DIAGNOSIS — I442 Atrioventricular block, complete: Secondary | ICD-10-CM | POA: Diagnosis not present

## 2019-07-29 DIAGNOSIS — E669 Obesity, unspecified: Secondary | ICD-10-CM

## 2019-07-29 DIAGNOSIS — T148XXA Other injury of unspecified body region, initial encounter: Secondary | ICD-10-CM | POA: Insufficient documentation

## 2019-07-29 DIAGNOSIS — D563 Thalassemia minor: Secondary | ICD-10-CM

## 2019-07-29 DIAGNOSIS — E1169 Type 2 diabetes mellitus with other specified complication: Secondary | ICD-10-CM | POA: Diagnosis not present

## 2019-07-29 LAB — POCT GLYCOSYLATED HEMOGLOBIN (HGB A1C): HbA1c, POC (controlled diabetic range): 5.9 % (ref 0.0–7.0)

## 2019-07-29 NOTE — Patient Instructions (Signed)
Your a1c is 5.9, which is great! Please continue to take your metformin once a day.    I will get some labwork to further work up your bruising.  I will let you know the results after I get them.    I would like to see you back in 6 months or sooner if needed.    Have a great day,   Clemetine Marker, MD

## 2019-07-29 NOTE — Assessment & Plan Note (Signed)
No hx of bruising.  Has only occurred within the past week.  No hx of trauma.  Only taking metformin, amlodipine, and baby ASA every other day.  PLT have been 150-250 on previous cbc's.  Uncertain what is causing bruising at this time.  Will check platelet number and function.   - cbc, cmp, pt/inr, ptt - if these are normal and pt continues to have bruising, further workup needed.  Check vWF.

## 2019-07-29 NOTE — Assessment & Plan Note (Signed)
SOB has improved since her pacemaker adjustment. Follows with Dr. Lovena Le.

## 2019-07-29 NOTE — Assessment & Plan Note (Signed)
Pt asymptomatic carrier.  Pt has informed son of diagnosis and possibility for inheritence.

## 2019-07-29 NOTE — Progress Notes (Signed)
   [facility] Phone: [phone]     Jackson Fetters Saleema Weppler - 53 y.o. female MRN [id number]  Date of birth: 1966-03-20  Subjective:   cc: bruising  HPI:  Bruises: bruising on left arm over the past week.  Three bruises in total. Most distal one was on the proximal forearm, other two were on the upper arm.  Takes baby aspirin EOD.   Has been taking this for months to years without problems. No weakness, dizziness.   Denies vaginal, urinary, rectal, nasal, gingival bleeding. No pets, no history of trauma.   SOB: breathing was labored, not feeling well. Discussed with her cardiologist, who got pacemaker interrogation and adjusted some settings.  Now she feels good and SOB has resolved.   DM: taking metformin in the AM with meals.  No n/v.    Alpha thal: patient informed her son about her results. Has a granddaughter.  Will let her know when she is old enough.    ROS: See HPI for pertinent positives and negatives  Past Medical History  Family history reviewed for today's visit. No changes.  Social history- patient is a non smoker. Works at the [employer].  Has been having to put in a lot of overtime lately.    Objective:   BP 140/88   Pulse 68   Wt 205 lb 9.6 oz (93.3 kg)   SpO2 100%   BMI 35.29 kg/m  Gen: NAD, alert and oriented, cooperative with exam CV: systolic murmur.  Regular rhythm.   Resp: LCTAB, no wheezes, crackles. normal work of breathing Msk: no lower extremity edema Skin: bruise on left upper arm, anteriorly approximately size of a half dollar.   Psych: Appropriate behavior  Assessment/Plan:   Bruising No hx of bruising.  Has only occurred within the past week.  No hx of trauma.  Only taking metformin, amlodipine, and baby ASA every other day.  PLT have been 150-250 on previous cbc's.  Uncertain what is causing bruising at this time.  Will check platelet number and function.   - cbc, cmp, pt/inr, ptt - if these are normal and pt  continues to have bruising, further workup needed.  Check vWF.    Diabetes mellitus type 2 in obese ([facility]) Taking metformin. No SE.  a1c is 5.9.  - continue metformin.  - a1c in 6 months  Alpha thalassaemia minor Pt asymptomatic carrier.  Pt has informed son of diagnosis and possibility for inheritence.    Complete heart block ([facility]) SOB has improved since her pacemaker adjustment. Follows with Dr. Lovena Le.     Orders Placed This Encounter  Procedures  . CBC with Differential  . Protime-INR  . APTT  . Comprehensive metabolic panel    Order Specific Question:   Has the patient fasted?    Answer:   No  . POCT glycosylated hemoglobin (Hb A1C)    No orders of the defined types were placed in this encounter.    Clemetine Marker, MD PGY-2 [facility]

## 2019-07-29 NOTE — Assessment & Plan Note (Signed)
Taking metformin. No SE.  a1c is 5.9.  - continue metformin.  - a1c in 6 months

## 2019-07-30 LAB — CBC WITH DIFFERENTIAL/PLATELET
Basophils Absolute: 0 10*3/uL (ref 0.0–0.2)
Basos: 1 %
EOS (ABSOLUTE): 0.1 10*3/uL (ref 0.0–0.4)
Eos: 2 %
Hematocrit: 33.6 % — ABNORMAL LOW (ref 34.0–46.6)
Hemoglobin: 10.8 g/dL — ABNORMAL LOW (ref 11.1–15.9)
Immature Grans (Abs): 0 10*3/uL (ref 0.0–0.1)
Immature Granulocytes: 0 %
Lymphocytes Absolute: 1.7 10*3/uL (ref 0.7–3.1)
Lymphs: 39 %
MCH: 20.8 pg — ABNORMAL LOW (ref 26.6–33.0)
MCHC: 32.1 g/dL (ref 31.5–35.7)
MCV: 65 fL — ABNORMAL LOW (ref 79–97)
Monocytes Absolute: 0.4 10*3/uL (ref 0.1–0.9)
Monocytes: 9 %
Neutrophils Absolute: 2.1 10*3/uL (ref 1.4–7.0)
Neutrophils: 49 %
Platelets: 246 10*3/uL (ref 150–450)
RBC: 5.18 x10E6/uL (ref 3.77–5.28)
RDW: 18.4 % — ABNORMAL HIGH (ref 11.7–15.4)
WBC: 4.3 10*3/uL (ref 3.4–10.8)

## 2019-07-30 LAB — COMPREHENSIVE METABOLIC PANEL
ALT: 14 IU/L (ref 0–32)
AST: 21 IU/L (ref 0–40)
Albumin/Globulin Ratio: 1.7 (ref 1.2–2.2)
Albumin: 4.3 g/dL (ref 3.8–4.9)
Alkaline Phosphatase: 83 IU/L (ref 39–117)
BUN/Creatinine Ratio: 17 (ref 9–23)
BUN: 15 mg/dL (ref 6–24)
Bilirubin Total: <0.2 mg/dL (ref 0.0–1.2)
CO2: 22 mmol/L (ref 20–29)
Calcium: 9.2 mg/dL (ref 8.7–10.2)
Chloride: 107 mmol/L — ABNORMAL HIGH (ref 96–106)
Creatinine, Ser: 0.9 mg/dL (ref 0.57–1.00)
GFR calc Af Amer: 83 mL/min/{1.73_m2} (ref >59)
GFR calc non Af Amer: 72 mL/min/{1.73_m2} (ref >59)
Globulin, Total: 2.6 g/dL (ref 1.5–4.5)
Glucose: 94 mg/dL (ref 65–99)
Potassium: 4.9 mmol/L (ref 3.5–5.2)
Sodium: 142 mmol/L (ref 134–144)
Total Protein: 6.9 g/dL (ref 6.0–8.5)

## 2019-07-30 LAB — PROTIME-INR
INR: 1 (ref 0.8–1.2)
Prothrombin Time: 10.6 s (ref 9.1–12.0)

## 2019-07-30 LAB — APTT: aPTT: 29 s (ref 24–33)

## 2019-08-08 ENCOUNTER — Telehealth: Payer: Self-pay | Admitting: Family Medicine

## 2019-08-08 NOTE — Telephone Encounter (Signed)
Called to inform pt about normal platelet levels and function on lab work ordered. Patient states she has not had any more brusiing since that time.  Told pt if she does develop more bruising we can do further workup but none is necessary at the moment given resolution of symptoms.

## 2019-08-09 ENCOUNTER — Other Ambulatory Visit: Payer: Self-pay

## 2019-08-09 MED ORDER — METFORMIN HCL 500 MG PO TABS: 500.0000 mg | ORAL_TABLET | Freq: Every day | ORAL | 0 refills | Status: DC

## 2019-09-02 ENCOUNTER — Ambulatory Visit (INDEPENDENT_AMBULATORY_CARE_PROVIDER_SITE_OTHER): Payer: Federal, State, Local not specified - PPO | Admitting: *Deleted

## 2019-09-02 DIAGNOSIS — I442 Atrioventricular block, complete: Secondary | ICD-10-CM | POA: Diagnosis not present

## 2019-09-02 LAB — CUP PACEART REMOTE DEVICE CHECK
Battery Remaining Longevity: 140 mo
Battery Voltage: 3.14 V
Brady Statistic AP VP Percent: 19.33 %
Brady Statistic AP VS Percent: 0.18 %
Brady Statistic AS VP Percent: 77.04 %
Brady Statistic AS VS Percent: 3.45 %
Brady Statistic RA Percent Paced: 19.57 %
Brady Statistic RV Percent Paced: 96.37 %
Date Time Interrogation Session: 20201102024725
Implantable Lead Implant Date: 20200504
Implantable Lead Implant Date: 20200504
Implantable Lead Location: 753859
Implantable Lead Location: 753860
Implantable Lead Model: 3830
Implantable Lead Model: 5076
Implantable Pulse Generator Implant Date: 20200504
Lead Channel Impedance Value: 304 Ohm
Lead Channel Impedance Value: 399 Ohm
Lead Channel Impedance Value: 437 Ohm
Lead Channel Impedance Value: 570 Ohm
Lead Channel Pacing Threshold Amplitude: 0.625 V
Lead Channel Pacing Threshold Amplitude: 1.125 V
Lead Channel Pacing Threshold Pulse Width: 0.4 ms
Lead Channel Pacing Threshold Pulse Width: 0.4 ms
Lead Channel Sensing Intrinsic Amplitude: 15.375 mV
Lead Channel Sensing Intrinsic Amplitude: 15.375 mV
Lead Channel Sensing Intrinsic Amplitude: 3.625 mV
Lead Channel Sensing Intrinsic Amplitude: 3.625 mV
Lead Channel Setting Pacing Amplitude: 1.5 V
Lead Channel Setting Pacing Amplitude: 2.5 V
Lead Channel Setting Pacing Pulse Width: 1 ms
Lead Channel Setting Sensing Sensitivity: 1.2 mV

## 2019-09-23 ENCOUNTER — Ambulatory Visit (HOSPITAL_COMMUNITY)
Admission: EM | Admit: 2019-09-23 | Discharge: 2019-09-23 | Disposition: A | Discharge status: Home / Self Care | Payer: Federal, State, Local not specified - PPO

## 2019-09-23 ENCOUNTER — Encounter (HOSPITAL_COMMUNITY): Payer: Self-pay

## 2019-09-23 ENCOUNTER — Other Ambulatory Visit: Payer: Self-pay

## 2019-09-23 DIAGNOSIS — N950 Postmenopausal bleeding: Secondary | ICD-10-CM | POA: Diagnosis not present

## 2019-09-23 NOTE — ED Triage Notes (Addendum)
Pt states she has not had a period since 2019. And today she started . Pt is not sure why that is happening. Pt b/p 162/93 she forgot to take her b/p this morning.

## 2019-09-23 NOTE — Discharge Instructions (Addendum)
I am giving you a contact for OB/GYN follow-up Call them today to set up an appointment for the bleeding

## 2019-09-23 NOTE — ED Provider Notes (Signed)
[place]    CSN: [id number] Arrival date & time: 09/23/19  4742      History   Chief Complaint Chief Complaint  Patient presents with  . period    HPI Joy Leonard is a 53 y.o. female.   Patient is a 53 year old female with past medical history of pacemaker, hypertension, menopause, alpha thalassemia, diabetes.  She presents today with vaginal bleeding.  This started this morning.  Symptoms have been constant.  Last menstrual period was July 2019.  She is postmenopausal.  Denies any abdominal pain, vaginal discharge, dysuria, hematuria or urinary frequency.  Has not been sexually active in approximately months.  Recently moved here and does not have an OB/GYN.  ROS per HPI       Past Medical History:  Diagnosis Date  . Hypertension     Patient Active Problem List   Diagnosis Date Noted  . Bruising 07/29/2019  . Pacemaker 06/05/2019  . Hypertension 04/20/2019  . Menopause 04/20/2019  . Alpha thalassaemia minor 04/20/2019  . Healthcare maintenance 04/20/2019  . Diabetes mellitus type 2 in obese ([place]) 04/20/2019  . Complete heart block ([place]) 03/02/2019    Past Surgical History:  Procedure Laterality Date  . CESAREAN SECTION  1990  . PACEMAKER IMPLANT N/A 03/04/2019   Procedure: PACEMAKER IMPLANT;  Surgeon: Evans Lance, MD;  Location: [facility];  Service: Cardiovascular;  Laterality: N/A;    OB History   No obstetric history on file.      Home Medications    Prior to Admission medications   Medication Sig Start Date End Date Taking? Authorizing Provider  amLODipine (NORVASC) 10 MG tablet Take 1 tablet (10 mg total) by mouth daily. 04/17/19   Benay Pike, MD  metFORMIN (GLUCOPHAGE) 500 MG tablet Take 1 tablet (500 mg total) by mouth daily with breakfast. 08/09/19   Benay Pike, MD  Multiple Vitamin (MULTIVITAMIN WITH MINERALS) TABS tablet Take 1 tablet by mouth daily.    [provider]   trimethoprim-polymyxin b (POLYTRIM) ophthalmic solution Place 1 drop into the left eye every 4 (four) hours. 04/23/19   Orvan July, NP    Family History Family History  Problem Relation Age of Onset  . Hypertension Mother   . Diabetes Mellitus II Mother   . Dementia Father   . Heart disease Other        had pacemaker    Social History Social History   Tobacco Use  . Smoking status: Never Smoker  . Smokeless tobacco: Never Used  Substance Use Topics  . Alcohol use: Never    Frequency: Never  . Drug use: Never     Allergies   Lisinopril   Review of Systems Review of Systems   Physical Exam Triage Vital Signs ED Triage Vitals  Enc Vitals Group     BP 09/23/19 1019 (!) 162/93     Pulse Rate 09/23/19 1019 73     Resp 09/23/19 1019 18     Temp 09/23/19 1019 98.5 F (36.9 C)     Temp src --      SpO2 09/23/19 1019 100 %     Weight 09/23/19 1016 199 lb (90.3 kg)     Height --      Head Circumference --      Peak Flow --      Pain Score 09/23/19 1016 0     Pain Loc --      Pain Edu? --  Excl. in GC? --    No data found.  Updated Vital Signs BP (!) 162/93 (BP Location: Right Arm)   Pulse 73   Temp 98.5 F (36.9 C)   Resp 18   Wt 199 lb (90.3 kg)   LMP 09/23/2019   SpO2 100%   BMI 34.16 kg/m   Visual Acuity Right Eye Distance:   Left Eye Distance:   Bilateral Distance:    Right Eye Near:   Left Eye Near:    Bilateral Near:     Physical Exam Vitals signs and nursing note reviewed.  Constitutional:      General: She is not in acute distress.    Appearance: Normal appearance. She is not ill-appearing, toxic-appearing or diaphoretic.  HENT:     Head: Normocephalic.     Nose: Nose normal.     Mouth/Throat:     Pharynx: Oropharynx is clear.  Eyes:     Conjunctiva/sclera: Conjunctivae normal.  Neck:     Musculoskeletal: Normal range of motion.  Pulmonary:     Effort: Pulmonary effort is normal.  Abdominal:     Palpations: Abdomen is  soft.     Tenderness: There is no abdominal tenderness.  Genitourinary:    Vagina: Normal.     Cervix: Cervical bleeding present.     Uterus: Normal. Not tender.   Musculoskeletal: Normal range of motion.  Skin:    General: Skin is warm and dry.     Findings: No rash.  Neurological:     Mental Status: She is alert.  Psychiatric:        Mood and Affect: Mood normal.      UC Treatments / Results  Labs (all labs ordered are listed, but only abnormal results are displayed) Labs Reviewed - No data to display  EKG   Radiology No results found.  Procedures Procedures (including critical care time)  Medications Ordered in UC Medications - No data to display  Initial Impression / Assessment and Plan / UC Course  I have reviewed the triage vital signs and the nursing notes.  Pertinent labs & imaging results that were available during my care of the patient were reviewed by me and considered in my medical decision making (see chart for details).     Postmenopausal bleeding-no pain or abnormalities felt on exam.  Small clots on exam with dark red vaginal bleeding We will send her to OB/GYN for follow-up No concerning signs or symptoms of anemia today. Final Clinical Impressions(s) / UC Diagnoses   Final diagnoses:  Postmenopausal bleeding     Discharge Instructions     I am giving you a contact for OB/GYN follow-up Call them today to set up an appointment for the bleeding     ED Prescriptions    None     PDMP not reviewed this encounter.   Janace Aris, NP 09/23/19 1128

## 2019-09-24 NOTE — Progress Notes (Signed)
Remote pacemaker transmission.   

## 2019-10-02 ENCOUNTER — Ambulatory Visit: Payer: Federal, State, Local not specified - PPO | Admitting: Family Medicine

## 2019-10-03 DIAGNOSIS — N950 Postmenopausal bleeding: Secondary | ICD-10-CM | POA: Diagnosis not present

## 2019-10-03 DIAGNOSIS — Z6833 Body mass index (BMI) 33.0-33.9, adult: Secondary | ICD-10-CM | POA: Diagnosis not present

## 2019-10-11 ENCOUNTER — Other Ambulatory Visit: Payer: Self-pay | Admitting: *Deleted

## 2019-10-11 MED ORDER — METFORMIN HCL 500 MG PO TABS: 500.0000 mg | ORAL_TABLET | Freq: Every day | ORAL | 0 refills | Status: DC

## 2019-10-15 DIAGNOSIS — N950 Postmenopausal bleeding: Secondary | ICD-10-CM | POA: Diagnosis not present

## 2019-12-03 ENCOUNTER — Ambulatory Visit (INDEPENDENT_AMBULATORY_CARE_PROVIDER_SITE_OTHER): Payer: Federal, State, Local not specified - PPO | Admitting: *Deleted

## 2019-12-03 DIAGNOSIS — I442 Atrioventricular block, complete: Secondary | ICD-10-CM | POA: Diagnosis not present

## 2019-12-03 LAB — CUP PACEART REMOTE DEVICE CHECK
Battery Remaining Longevity: 126 mo
Battery Voltage: 3.07 V
Brady Statistic AP VP Percent: 16.47 %
Brady Statistic AP VS Percent: 0.61 %
Brady Statistic AS VP Percent: 73.82 %
Brady Statistic AS VS Percent: 9.11 %
Brady Statistic RA Percent Paced: 17.5 %
Brady Statistic RV Percent Paced: 90.29 %
Date Time Interrogation Session: 20210201220330
Implantable Lead Implant Date: 20200504
Implantable Lead Implant Date: 20200504
Implantable Lead Location: 753859
Implantable Lead Location: 753860
Implantable Lead Model: 3830
Implantable Lead Model: 5076
Implantable Pulse Generator Implant Date: 20200504
Lead Channel Impedance Value: 304 Ohm
Lead Channel Impedance Value: 380 Ohm
Lead Channel Impedance Value: 418 Ohm
Lead Channel Impedance Value: 608 Ohm
Lead Channel Pacing Threshold Amplitude: 0.5 V
Lead Channel Pacing Threshold Amplitude: 1.25 V
Lead Channel Pacing Threshold Pulse Width: 0.4 ms
Lead Channel Pacing Threshold Pulse Width: 0.4 ms
Lead Channel Sensing Intrinsic Amplitude: 15.875 mV
Lead Channel Sensing Intrinsic Amplitude: 15.875 mV
Lead Channel Sensing Intrinsic Amplitude: 3 mV
Lead Channel Sensing Intrinsic Amplitude: 3 mV
Lead Channel Setting Pacing Amplitude: 1.5 V
Lead Channel Setting Pacing Amplitude: 2.5 V
Lead Channel Setting Pacing Pulse Width: 1 ms
Lead Channel Setting Sensing Sensitivity: 1.2 mV

## 2019-12-03 NOTE — Progress Notes (Signed)
PPM Remote  

## 2019-12-11 ENCOUNTER — Other Ambulatory Visit: Payer: Self-pay | Admitting: *Deleted

## 2019-12-13 MED ORDER — METFORMIN HCL 500 MG PO TABS: 500.0000 mg | ORAL_TABLET | Freq: Every day | ORAL | 0 refills | Status: DC

## 2019-12-17 ENCOUNTER — Telehealth: Payer: Self-pay

## 2019-12-18 DIAGNOSIS — Z6834 Body mass index (BMI) 34.0-34.9, adult: Secondary | ICD-10-CM | POA: Diagnosis not present

## 2019-12-18 DIAGNOSIS — Z1151 Encounter for screening for human papillomavirus (HPV): Secondary | ICD-10-CM | POA: Diagnosis not present

## 2019-12-18 DIAGNOSIS — Z01419 Encounter for gynecological examination (general) (routine) without abnormal findings: Secondary | ICD-10-CM | POA: Diagnosis not present

## 2019-12-31 ENCOUNTER — Other Ambulatory Visit: Payer: Self-pay

## 2019-12-31 ENCOUNTER — Encounter: Payer: Self-pay | Admitting: Family Medicine

## 2019-12-31 ENCOUNTER — Ambulatory Visit (INDEPENDENT_AMBULATORY_CARE_PROVIDER_SITE_OTHER): Payer: Federal, State, Local not specified - PPO | Admitting: Family Medicine

## 2019-12-31 VITALS — BP 142/80 | HR 73 | Wt 198.0 lb

## 2019-12-31 DIAGNOSIS — E669 Obesity, unspecified: Secondary | ICD-10-CM | POA: Diagnosis not present

## 2019-12-31 DIAGNOSIS — Z23 Encounter for immunization: Secondary | ICD-10-CM | POA: Diagnosis not present

## 2019-12-31 DIAGNOSIS — E1169 Type 2 diabetes mellitus with other specified complication: Secondary | ICD-10-CM | POA: Diagnosis not present

## 2019-12-31 DIAGNOSIS — D563 Thalassemia minor: Secondary | ICD-10-CM | POA: Diagnosis not present

## 2019-12-31 DIAGNOSIS — R7303 Prediabetes: Secondary | ICD-10-CM

## 2019-12-31 DIAGNOSIS — E119 Type 2 diabetes mellitus without complications: Secondary | ICD-10-CM | POA: Diagnosis not present

## 2019-12-31 DIAGNOSIS — N950 Postmenopausal bleeding: Secondary | ICD-10-CM

## 2019-12-31 DIAGNOSIS — I442 Atrioventricular block, complete: Secondary | ICD-10-CM

## 2019-12-31 LAB — POCT GLYCOSYLATED HEMOGLOBIN (HGB A1C): HbA1c, POC (prediabetic range): 6 % (ref 5.7–6.4)

## 2019-12-31 LAB — POCT UA - MICROALBUMIN
Albumin/Creatinine Ratio, Urine, POC: <30
Creatinine, POC: 50 mg/dL
Microalbumin Ur, POC: 10 mg/L

## 2019-12-31 NOTE — Progress Notes (Signed)
    SUBJECTIVE:   CHIEF COMPLAINT / HPI:   Postmenopausal bleeding: no more episodes since her ed visit.  Lasted 3 days. Like a regular period.  Went to an OBGYN who did an ultrasound and a tissue sample.  Pt states results were normal.    DM: diet controlled.  Avoiding rice and sweets.  Drinking more water.  Making her own vegetable juices. .    Care gaps: hep C, PNA vax, foot, optho exam, microalbumin, Tdap, pap, colonoscopy (age 54, not inrecords). Doesn't know the name of the GI doc.   PERTINENT  PMH / PSH: complete heart block.   OBJECTIVE:   BP (!) 142/80   Pulse 73   Wt 198 lb (89.8 kg)   LMP 09/23/2019   SpO2 100%   BMI 33.99 kg/m   Gen: alert and oriented. Appears stated age. No acute distress. CV: regular rate and rhythm.  1/6 systolic murmur appreciated. No pedal edema.  PULM: lungs clear to auscultation bilaterally. No crackles or wheezes.  Extremities: foot exam normal.  No ulcers. DP Pulses present. Sensation intact.    ASSESSMENT/PLAN:   Prediabetes A1c's have been normal since her initial 6.6% technically making her prediabetic. Has been controlling it with diet.  Would like to stop her metformin.  - stop metformin - continue with new diet changes.  - f/u a1c in 6 months.  - ophtho referral - microalbumin  Post-menopausal bleeding One time event in December.  Has had negative workup with GYN since that time.  - continue to monitor.  - cbc given hx of anemia.   Complete heart block (HCC) No complaints of chest pain, SOB.  Recently had her pacer checked.   - treatment per electrophysiology/cardiology.      Joy Kitty, MD [facility]

## 2019-12-31 NOTE — Patient Instructions (Addendum)
It was nice to see you again today,   I have put in a referral to ophthalmology exam.  Please call the doctors from the list provided in this packet.    I will call you back with the results of your lab work if it is abnormal.    You can stop taking your metformin.  We will follow your A1c every six months.    Please make an appointment for 6 months from now.     Frederic Jericho, MD

## 2020-01-01 DIAGNOSIS — N950 Postmenopausal bleeding: Secondary | ICD-10-CM | POA: Insufficient documentation

## 2020-01-01 LAB — CBC
Hematocrit: 37.5 % (ref 34.0–46.6)
Hemoglobin: 11.5 g/dL (ref 11.1–15.9)
MCH: 21.3 pg — ABNORMAL LOW (ref 26.6–33.0)
MCHC: 30.7 g/dL — ABNORMAL LOW (ref 31.5–35.7)
MCV: 70 fL — ABNORMAL LOW (ref 79–97)
Platelets: 253 10*3/uL (ref 150–450)
RBC: 5.39 x10E6/uL — ABNORMAL HIGH (ref 3.77–5.28)
RDW: 18.5 % — ABNORMAL HIGH (ref 11.7–15.4)
WBC: 4.1 10*3/uL (ref 3.4–10.8)

## 2020-01-01 NOTE — Assessment & Plan Note (Addendum)
A1c's have been normal since her initial 6.6% technically making her prediabetic. Has been controlling it with diet.  Would like to stop her metformin.  - stop metformin - continue with new diet changes.  - f/u a1c in 6 months.  - ophtho referral - microalbumin

## 2020-01-01 NOTE — Assessment & Plan Note (Addendum)
One time event in December.  Has had negative workup with GYN since that time.  - continue to monitor.  - cbc given hx of anemia.

## 2020-01-01 NOTE — Assessment & Plan Note (Signed)
No complaints of chest pain, SOB.  Recently had her pacer checked.   - treatment per electrophysiology/cardiology.

## 2020-01-29 ENCOUNTER — Other Ambulatory Visit: Payer: Self-pay | Admitting: *Deleted

## 2020-01-29 MED ORDER — AMLODIPINE BESYLATE 10 MG PO TABS: 10.0000 mg | ORAL_TABLET | Freq: Every day | ORAL | 2 refills | Status: DC

## 2020-02-15 ENCOUNTER — Other Ambulatory Visit: Payer: Self-pay | Admitting: Family Medicine

## 2020-03-03 ENCOUNTER — Ambulatory Visit (INDEPENDENT_AMBULATORY_CARE_PROVIDER_SITE_OTHER): Payer: Federal, State, Local not specified - PPO | Admitting: *Deleted

## 2020-03-03 DIAGNOSIS — I442 Atrioventricular block, complete: Secondary | ICD-10-CM | POA: Diagnosis not present

## 2020-03-03 LAB — CUP PACEART REMOTE DEVICE CHECK
Battery Remaining Longevity: 117 mo
Battery Voltage: 3.02 V
Brady Statistic AP VP Percent: 12.59 %
Brady Statistic AP VS Percent: 0.62 %
Brady Statistic AS VP Percent: 66.93 %
Brady Statistic AS VS Percent: 19.86 %
Brady Statistic RA Percent Paced: 13.52 %
Brady Statistic RV Percent Paced: 79.52 %
Date Time Interrogation Session: 20210504032735
Implantable Lead Implant Date: 20200504
Implantable Lead Implant Date: 20200504
Implantable Lead Location: 753859
Implantable Lead Location: 753860
Implantable Lead Model: 3830
Implantable Lead Model: 5076
Implantable Pulse Generator Implant Date: 20200504
Lead Channel Impedance Value: 285 Ohm
Lead Channel Impedance Value: 380 Ohm
Lead Channel Impedance Value: 399 Ohm
Lead Channel Impedance Value: 570 Ohm
Lead Channel Pacing Threshold Amplitude: 0.5 V
Lead Channel Pacing Threshold Amplitude: 1.25 V
Lead Channel Pacing Threshold Pulse Width: 0.4 ms
Lead Channel Pacing Threshold Pulse Width: 0.4 ms
Lead Channel Sensing Intrinsic Amplitude: 14.875 mV
Lead Channel Sensing Intrinsic Amplitude: 14.875 mV
Lead Channel Sensing Intrinsic Amplitude: 3.375 mV
Lead Channel Sensing Intrinsic Amplitude: 3.375 mV
Lead Channel Setting Pacing Amplitude: 1.5 V
Lead Channel Setting Pacing Amplitude: 2.5 V
Lead Channel Setting Pacing Pulse Width: 1 ms
Lead Channel Setting Sensing Sensitivity: 1.2 mV

## 2020-03-04 NOTE — Progress Notes (Signed)
Remote pacemaker transmission.   

## 2020-04-15 ENCOUNTER — Ambulatory Visit: Payer: Federal, State, Local not specified - PPO | Admitting: Internal Medicine

## 2020-04-15 ENCOUNTER — Other Ambulatory Visit: Payer: Self-pay

## 2020-04-15 VITALS — BP 124/78 | HR 82 | Ht 64.0 in | Wt 208.0 lb

## 2020-04-15 DIAGNOSIS — I442 Atrioventricular block, complete: Secondary | ICD-10-CM

## 2020-04-15 DIAGNOSIS — Z950 Presence of cardiac pacemaker: Secondary | ICD-10-CM | POA: Diagnosis not present

## 2020-04-15 DIAGNOSIS — I10 Essential (primary) hypertension: Secondary | ICD-10-CM

## 2020-04-15 NOTE — Progress Notes (Signed)
HPI Mrs. [address] Joy Leonard returns today for followup. She is a pleasant 54 yo woman with a h/o HTN, DM, high grade heart block, s/p PPM insertion. She denies chest pain or sob. No edema. She has not had syncope since her device was placed.  Allergies  Allergen Reactions  . Lisinopril Swelling     Current Outpatient Medications  Medication Sig Dispense Refill  . amLODipine (NORVASC) 10 MG tablet Take 1 tablet (10 mg total) by mouth daily. 90 tablet 2  . Multiple Vitamin (MULTIVITAMIN WITH MINERALS) TABS tablet Take 1 tablet by mouth daily.     No current facility-administered medications for this visit.     Past Medical History:  Diagnosis Date  . Hypertension     ROS:   All systems reviewed and negative except as noted in the HPI.   Past Surgical History:  Procedure Laterality Date  . CESAREAN SECTION  1990  . PACEMAKER IMPLANT N/A 03/04/2019   Procedure: PACEMAKER IMPLANT;  Surgeon: Marinus Maw, MD;  Location: [facility];  Service: Cardiovascular;  Laterality: N/A;     Family History  Problem Relation Age of Onset  . Hypertension Mother   . Diabetes Mellitus II Mother   . Dementia Father   . Heart disease Other        had pacemaker     Social History   Socioeconomic History  . Marital status: Single    Spouse name: Not on file  . Number of children: Not on file  . Years of education: Not on file  . Highest education level: Not on file  Occupational History  . Not on file  Tobacco Use  . Smoking status: Never Smoker  . Smokeless tobacco: Never Used  Vaping Use  . Vaping Use: Never used  Substance and Sexual Activity  . Alcohol use: Never  . Drug use: Never  . Sexual activity: Not on file  Other Topics Concern  . Not on file  Social History Narrative  . Not on file   Social Determinants of Health   Financial Resource Strain:   . Difficulty of Paying Living Expenses:   Food Insecurity:   . Worried About Programme researcher, broadcasting/film/video in the  Last Year:   . Barista in the Last Year:   Transportation Needs:   . Freight forwarder (Medical):   Marland Kitchen Lack of Transportation (Non-Medical):   Physical Activity:   . Days of Exercise per Week:   . Minutes of Exercise per Session:   Stress:   . Feeling of Stress :   Social Connections:   . Frequency of Communication with Friends and Family:   . Frequency of Social Gatherings with Friends and Family:   . Attends Religious Services:   . Active Member of Clubs or Organizations:   . Attends Banker Meetings:   Marland Kitchen Marital Status:   Intimate Partner Violence:   . Fear of Current or Ex-Partner:   . Emotionally Abused:   Marland Kitchen Physically Abused:   . Sexually Abused:      BP 124/78   Pulse 82   Ht 5\' 4"  (1.626 m)   Wt 208 lb (94.3 kg)   LMP 09/23/2019   BMI 35.70 kg/m   Physical Exam:  Well appearing NAD HEENT: Unremarkable Neck:  No JVD, no thyromegally Lymphatics:  No adenopathy Back:  No CVA tenderness Lungs:  Clear with no wheezes HEART:  Regular rate rhythm, no  murmurs, no rubs, no clicks Abd:  soft, positive bowel sounds, no organomegally, no rebound, no guarding Ext:  2 plus pulses, no edema, no cyanosis, no clubbing Skin:  No rashes no nodules Neuro:  CN II through XII intact, motor grossly intact  EKG - nsr with RBBB  DEVICE  Normal device function.  See PaceArt for details.   Assess/Plan: 1. Heart block - she is asymptomatic, s/p PPM insertion.  2. PPM - her medtronic DDD PM is working normally.  3. HTN - her bp is well controlled. No change.   Khyran Riera,M.D. 1. Heart block - she is doing well, s/p PPM insertion. She is conducting today and we have reprogrammed her device to allow for conduction. 2. PPM - her medtronic DDD PM is working normally.  3.

## 2020-04-15 NOTE — Patient Instructions (Signed)
Medication Instructions:  Your physician recommends that you continue on your current medications as directed. Please refer to the Current Medication list given to you today.  Labwork: None ordered.  Testing/Procedures: None ordered.  Follow-Up: Your physician wants you to follow-up in: one year with Dr. Ladona Ridgel.   You will receive a reminder letter in the mail two months in advance. If you don't receive a letter, please call our office to schedule the follow-up appointment.  Remote monitoring is used to monitor your Pacemaker from home. This monitoring reduces the number of office visits required to check your device to one time per year. It allows [place] to keep an eye on the functioning of your device to ensure it is working properly. You are scheduled for a device check from home on 06/02/2020. You may send your transmission at any time that day. If you have a wireless device, the transmission will be sent automatically. After your physician reviews your transmission, you will receive a postcard with your next transmission date.  Any Other Special Instructions Will Be Listed Below (If Applicable).  If you need a refill on your cardiac medications before your next appointment, please call your pharmacy.

## 2020-04-17 LAB — CUP PACEART INCLINIC DEVICE CHECK
Battery Remaining Longevity: 134 mo
Battery Voltage: 3.01 V
Brady Statistic AP VP Percent: 14.8 %
Brady Statistic AP VS Percent: 0.53 %
Brady Statistic AS VP Percent: 72.98 %
Brady Statistic AS VS Percent: 11.7 %
Brady Statistic RA Percent Paced: 15.61 %
Brady Statistic RV Percent Paced: 87.78 %
Date Time Interrogation Session: 20210616153600
Implantable Lead Implant Date: 20200504
Implantable Lead Implant Date: 20200504
Implantable Lead Location: 753859
Implantable Lead Location: 753860
Implantable Lead Model: 3830
Implantable Lead Model: 5076
Implantable Pulse Generator Implant Date: 20200504
Lead Channel Impedance Value: 323 Ohm
Lead Channel Impedance Value: 418 Ohm
Lead Channel Impedance Value: 418 Ohm
Lead Channel Impedance Value: 589 Ohm
Lead Channel Pacing Threshold Amplitude: 0.5 V
Lead Channel Pacing Threshold Amplitude: 0.75 V
Lead Channel Pacing Threshold Pulse Width: 0.4 ms
Lead Channel Pacing Threshold Pulse Width: 0.5 ms
Lead Channel Sensing Intrinsic Amplitude: 18.5 mV
Lead Channel Sensing Intrinsic Amplitude: 4.8 mV
Lead Channel Setting Pacing Amplitude: 1.5 V
Lead Channel Setting Pacing Amplitude: 2.5 V
Lead Channel Setting Pacing Pulse Width: 0.5 ms
Lead Channel Setting Sensing Sensitivity: 1.2 mV

## 2020-05-11 ENCOUNTER — Telehealth: Payer: Self-pay

## 2020-05-11 NOTE — Telephone Encounter (Signed)
Transmission received.

## 2020-05-11 NOTE — Telephone Encounter (Signed)
The pt states she feels tired and short of breath when she walks. She states the last time she felt that way she had to come in for adjustments. I had her send a transmission. I told her the nurse will review it. I let her know it is after 5 pm and she may not receive a call today but will get a call tomorrow morning. Pt verbalized understanding.

## 2020-05-12 NOTE — Telephone Encounter (Signed)
Manual transmission reviewed, no indication to explain pt symptoms.  Presenting EGM ASVS90s, recent changed include increasing SAV to .  Pt does appear to be pacing less.    Spoke with pt, she reports today she feels good, however she is concerned that feeling will return tomorrow.  Symptoms started on Sunday, include fatigue and SOB with exertion.  BP has been WNL.  Pt will hydrate and continue to monitor, if symtpoms return when she returns to work tomorrow, she will call for an appt.

## 2020-05-19 ENCOUNTER — Other Ambulatory Visit: Payer: Self-pay | Admitting: Family Medicine

## 2020-05-19 DIAGNOSIS — Z1231 Encounter for screening mammogram for malignant neoplasm of breast: Secondary | ICD-10-CM

## 2020-06-02 ENCOUNTER — Ambulatory Visit (INDEPENDENT_AMBULATORY_CARE_PROVIDER_SITE_OTHER): Payer: Federal, State, Local not specified - PPO | Admitting: *Deleted

## 2020-06-02 DIAGNOSIS — I442 Atrioventricular block, complete: Secondary | ICD-10-CM

## 2020-06-02 LAB — CUP PACEART REMOTE DEVICE CHECK
Battery Remaining Longevity: 132 mo
Battery Voltage: 3.04 V
Brady Statistic AP VP Percent: 14.7 %
Brady Statistic AP VS Percent: 0.43 %
Brady Statistic AS VP Percent: 2.82 %
Brady Statistic AS VS Percent: 82.05 %
Brady Statistic RA Percent Paced: 15.27 %
Brady Statistic RV Percent Paced: 17.52 %
Date Time Interrogation Session: 20210803012226
Implantable Lead Implant Date: 20200504
Implantable Lead Implant Date: 20200504
Implantable Lead Location: 753859
Implantable Lead Location: 753860
Implantable Lead Model: 3830
Implantable Lead Model: 5076
Implantable Pulse Generator Implant Date: 20200504
Lead Channel Impedance Value: 285 Ohm
Lead Channel Impedance Value: 361 Ohm
Lead Channel Impedance Value: 418 Ohm
Lead Channel Impedance Value: 513 Ohm
Lead Channel Pacing Threshold Amplitude: 0.5 V
Lead Channel Pacing Threshold Amplitude: 1.125 V
Lead Channel Pacing Threshold Pulse Width: 0.4 ms
Lead Channel Pacing Threshold Pulse Width: 0.4 ms
Lead Channel Sensing Intrinsic Amplitude: 14.625 mV
Lead Channel Sensing Intrinsic Amplitude: 14.625 mV
Lead Channel Sensing Intrinsic Amplitude: 3 mV
Lead Channel Sensing Intrinsic Amplitude: 3 mV
Lead Channel Setting Pacing Amplitude: 1.5 V
Lead Channel Setting Pacing Amplitude: 2.5 V
Lead Channel Setting Pacing Pulse Width: 0.5 ms
Lead Channel Setting Sensing Sensitivity: 1.2 mV

## 2020-06-03 ENCOUNTER — Other Ambulatory Visit: Payer: Self-pay

## 2020-06-03 ENCOUNTER — Ambulatory Visit
Admission: RE | Admit: 2020-06-03 | Discharge: 2020-06-03 | Disposition: A | Discharge status: Home / Self Care | Payer: Federal, State, Local not specified - PPO | Source: Ambulatory Visit | Attending: Family Medicine | Admitting: Family Medicine

## 2020-06-03 DIAGNOSIS — Z1231 Encounter for screening mammogram for malignant neoplasm of breast: Secondary | ICD-10-CM | POA: Diagnosis not present

## 2020-06-04 NOTE — Progress Notes (Signed)
Remote pacemaker transmission.   

## 2020-07-03 NOTE — Telephone Encounter (Signed)
error 

## 2020-09-01 ENCOUNTER — Ambulatory Visit (INDEPENDENT_AMBULATORY_CARE_PROVIDER_SITE_OTHER): Payer: Federal, State, Local not specified - PPO

## 2020-09-01 DIAGNOSIS — I442 Atrioventricular block, complete: Secondary | ICD-10-CM | POA: Diagnosis not present

## 2020-09-02 LAB — CUP PACEART REMOTE DEVICE CHECK
Battery Remaining Longevity: 131 mo
Battery Voltage: 3.03 V
Brady Statistic AP VP Percent: 11.34 %
Brady Statistic AP VS Percent: 0.48 %
Brady Statistic AS VP Percent: 6.67 %
Brady Statistic AS VS Percent: 81.51 %
Brady Statistic RA Percent Paced: 12 %
Brady Statistic RV Percent Paced: 18.01 %
Date Time Interrogation Session: 20211102134255
Implantable Lead Implant Date: 20200504
Implantable Lead Implant Date: 20200504
Implantable Lead Location: 753859
Implantable Lead Location: 753860
Implantable Lead Model: 3830
Implantable Lead Model: 5076
Implantable Pulse Generator Implant Date: 20200504
Lead Channel Impedance Value: 285 Ohm
Lead Channel Impedance Value: 399 Ohm
Lead Channel Impedance Value: 418 Ohm
Lead Channel Impedance Value: 494 Ohm
Lead Channel Pacing Threshold Amplitude: 0.625 V
Lead Channel Pacing Threshold Amplitude: 1 V
Lead Channel Pacing Threshold Pulse Width: 0.4 ms
Lead Channel Pacing Threshold Pulse Width: 0.4 ms
Lead Channel Sensing Intrinsic Amplitude: 18.375 mV
Lead Channel Sensing Intrinsic Amplitude: 18.375 mV
Lead Channel Sensing Intrinsic Amplitude: 4.25 mV
Lead Channel Sensing Intrinsic Amplitude: 4.25 mV
Lead Channel Setting Pacing Amplitude: 1.5 V
Lead Channel Setting Pacing Amplitude: 2.5 V
Lead Channel Setting Pacing Pulse Width: 0.5 ms
Lead Channel Setting Sensing Sensitivity: 1.2 mV

## 2020-09-03 NOTE — Progress Notes (Signed)
Remote pacemaker transmission.   

## 2020-11-02 DIAGNOSIS — Z20822 Contact with and (suspected) exposure to covid-19: Secondary | ICD-10-CM | POA: Diagnosis not present

## 2020-11-18 ENCOUNTER — Other Ambulatory Visit: Payer: Self-pay | Admitting: Family Medicine

## 2020-11-18 NOTE — Telephone Encounter (Signed)
Can we schedule an appointment with this patient? It's been almost a year since I've seen her.  No rush.  Last appointment was in march 2021.

## 2020-11-18 NOTE — Telephone Encounter (Signed)
Called patient and she said she would call back to schedule when she receives her work schedule for February

## 2020-12-01 ENCOUNTER — Ambulatory Visit (INDEPENDENT_AMBULATORY_CARE_PROVIDER_SITE_OTHER): Payer: Federal, State, Local not specified - PPO

## 2020-12-01 DIAGNOSIS — I442 Atrioventricular block, complete: Secondary | ICD-10-CM

## 2020-12-01 LAB — CUP PACEART REMOTE DEVICE CHECK
Battery Remaining Longevity: 126 mo
Battery Voltage: 3.01 V
Brady Statistic AP VP Percent: 9.1 %
Brady Statistic AP VS Percent: 0.1 %
Brady Statistic AS VP Percent: 81.84 %
Brady Statistic AS VS Percent: 8.96 %
Brady Statistic RA Percent Paced: 9.24 %
Brady Statistic RV Percent Paced: 90.94 %
Date Time Interrogation Session: 20220131235427
Implantable Lead Implant Date: 20200504
Implantable Lead Implant Date: 20200504
Implantable Lead Location: 753859
Implantable Lead Location: 753860
Implantable Lead Model: 3830
Implantable Lead Model: 5076
Implantable Pulse Generator Implant Date: 20200504
Lead Channel Impedance Value: 304 Ohm
Lead Channel Impedance Value: 399 Ohm
Lead Channel Impedance Value: 456 Ohm
Lead Channel Impedance Value: 513 Ohm
Lead Channel Pacing Threshold Amplitude: 0.625 V
Lead Channel Pacing Threshold Amplitude: 1.125 V
Lead Channel Pacing Threshold Pulse Width: 0.4 ms
Lead Channel Pacing Threshold Pulse Width: 0.4 ms
Lead Channel Sensing Intrinsic Amplitude: 14.625 mV
Lead Channel Sensing Intrinsic Amplitude: 14.625 mV
Lead Channel Sensing Intrinsic Amplitude: 4 mV
Lead Channel Sensing Intrinsic Amplitude: 4 mV
Lead Channel Setting Pacing Amplitude: 1.5 V
Lead Channel Setting Pacing Amplitude: 2.5 V
Lead Channel Setting Pacing Pulse Width: 0.5 ms
Lead Channel Setting Sensing Sensitivity: 1.2 mV

## 2020-12-09 NOTE — Progress Notes (Signed)
Remote pacemaker transmission.   

## 2020-12-15 ENCOUNTER — Ambulatory Visit (HOSPITAL_COMMUNITY)
Admission: EM | Admit: 2020-12-15 | Discharge: 2020-12-15 | Disposition: A | Discharge status: Home / Self Care | Payer: Federal, State, Local not specified - PPO

## 2020-12-15 ENCOUNTER — Encounter (HOSPITAL_COMMUNITY): Payer: Self-pay

## 2020-12-15 ENCOUNTER — Other Ambulatory Visit: Payer: Self-pay

## 2020-12-15 DIAGNOSIS — M1812 Unilateral primary osteoarthritis of first carpometacarpal joint, left hand: Secondary | ICD-10-CM

## 2020-12-15 NOTE — ED Triage Notes (Signed)
Pt presents with left wrist and thum pain for about 4 weeks that is progressing.

## 2020-12-15 NOTE — Discharge Instructions (Addendum)
Take Ibuprofen as needed Wear wrist splint, especially while at work

## 2020-12-15 NOTE — ED Provider Notes (Signed)
[facility]    CSN: [id number] Arrival date & time: 12/15/20  1806      History   Chief Complaint Chief Complaint  Patient presents with  . Wrist Pain    HPI Joy Leonard is a 55 y.o. female.   Pt complains of pain to the base of the thumb that started about 4 weeks ago.  Denies injury or trauma.  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  Movement makes the pain worse.  She has tried nothing for the sx.  She has never experienced similar sx.      Past Medical History:  Diagnosis Date  . Hypertension     Patient Active Problem List   Diagnosis Date Noted  . Post-menopausal bleeding 01/01/2020  . Bruising 07/29/2019  . Pacemaker 06/05/2019  . Hypertension 04/20/2019  . Menopause 04/20/2019  . Alpha thalassaemia minor 04/20/2019  . Healthcare maintenance 04/20/2019  . Prediabetes 04/20/2019  . Complete heart block ([facility]CC) 03/02/2019    Past Surgical History:  Procedure Laterality Date  . CESAREAN SECTION  1990  . PACEMAKER IMPLANT N/A 03/04/2019   Procedure: PACEMAKER IMPLANT;  Surgeon: Marinus Maw, MD;  Location: [facility];  Service: Cardiovascular;  Laterality: N/A;    OB History   No obstetric history on file.      Home Medications    Prior to Admission medications   Medication Sig Start Date End Date Taking? Authorizing Provider  amLODipine (NORVASC) 10 MG tablet TAKE 1 TABLET(10 MG) BY MOUTH DAILY 11/18/20   Sandre Kitty, MD  Multiple Vitamin (MULTIVITAMIN WITH MINERALS) TABS tablet Take 1 tablet by mouth daily.    [provider]    Family History Family History  Problem Relation Age of Onset  . Hypertension Mother   . Diabetes Mellitus II Mother   . Dementia Father   . Heart disease Other        had pacemaker    Social History Social History   Tobacco Use  . Smoking status: Never Smoker  . Smokeless tobacco: Never Used  Vaping Use  . Vaping Use:  Never used  Substance Use Topics  . Alcohol use: Never  . Drug use: Never     Allergies   Lisinopril   Review of Systems Review of Systems  Constitutional: Negative for chills and fever.  HENT: Negative for ear pain and sore throat.   Eyes: Negative for pain and visual disturbance.  Respiratory: Negative for cough and shortness of breath.   Cardiovascular: Negative for chest pain and palpitations.  Gastrointestinal: Negative for abdominal pain and vomiting.  Genitourinary: Negative for dysuria and hematuria.  Musculoskeletal: Positive for arthralgias (left thumb pain). Negative for back pain.  Skin: Negative for color change and rash.  Neurological: Negative for seizures and syncope.  All other systems reviewed and are negative.    Physical Exam Triage Vital Signs ED Triage Vitals  Enc Vitals Group     BP 12/15/20 1835 (!) 157/100     Pulse Rate 12/15/20 1835 76     Resp 12/15/20 1835 18     Temp 12/15/20 1835 98.7 F (37.1 C)     Temp Source 12/15/20 1835 Oral     SpO2 12/15/20 1835 96 %     Weight --      Height --      Head Circumference --      Peak Flow --  Pain Score 12/15/20 1837 6     Pain Loc --      Pain Edu? --      Excl. in GC? --    No data found.  Updated Vital Signs BP (!) 157/100 (BP Location: Left Arm)   Pulse 76   Temp 98.7 F (37.1 C) (Oral)   Resp 18   LMP 09/23/2019   SpO2 96%   Visual Acuity Right Eye Distance:   Left Eye Distance:   Bilateral Distance:    Right Eye Near:   Left Eye Near:    Bilateral Near:     Physical Exam Vitals and nursing note reviewed.  Constitutional:      General: She is not in acute distress.    Appearance: She is well-developed and well-nourished.  HENT:     Head: Normocephalic and atraumatic.  Eyes:     Conjunctiva/sclera: Conjunctivae normal.  Cardiovascular:     Rate and Rhythm: Normal rate and regular rhythm.     Heart sounds: No murmur heard.   Pulmonary:     Effort: Pulmonary  effort is normal. No respiratory distress.     Breath sounds: Normal breath sounds.  Abdominal:     Palpations: Abdomen is soft.     Tenderness: There is no abdominal tenderness.  Musculoskeletal:        General: No edema.     Right hand: Tenderness (to left CMC joint) present. Normal strength. Normal sensation. Normal pulse.     Cervical back: Neck supple.  Skin:    General: Skin is warm and dry.  Neurological:     Mental Status: She is alert.  Psychiatric:        Mood and Affect: Mood and affect normal.      UC Treatments / Results  Labs (all labs ordered are listed, but only abnormal results are displayed) Labs Reviewed - No data to display  EKG   Radiology No results found.  Procedures Procedures (including critical care time)  Medications Ordered in UC Medications - No data to display  Initial Impression / Assessment and Plan / UC Course  I have reviewed the triage vital signs and the nursing notes.  Pertinent labs & imaging results that were available during my care of the patient were reviewed by me and considered in my medical decision making (see chart for details).     Description and exam consistent with CMC arthritis.  Recommend immobilization, especially while at work; splint given today.  Advised ibuprofen as needed.  Apply ice to affected area.  If no improvement she will follow up with orthopedics.  Final Clinical Impressions(s) / UC Diagnoses   Final diagnoses:  Osteoarthritis of carpometacarpal (CMC) joint of left thumb, unspecified osteoarthritis type     Discharge Instructions     Take Ibuprofen as needed Wear wrist splint, especially while at work    ED Prescriptions    None     PDMP not reviewed this encounter.   Jodell Cipro, PA-C 12/15/20 1929

## 2021-01-06 DIAGNOSIS — M25532 Pain in left wrist: Secondary | ICD-10-CM | POA: Diagnosis not present

## 2021-01-06 DIAGNOSIS — M654 Radial styloid tenosynovitis [de Quervain]: Secondary | ICD-10-CM | POA: Diagnosis not present

## 2021-01-25 ENCOUNTER — Telehealth: Payer: Self-pay | Admitting: Internal Medicine

## 2021-01-25 NOTE — Telephone Encounter (Signed)
Paperwork received from patient on 01/25/2021. Paperwork has been placed in [employer]

## 2021-01-29 NOTE — Telephone Encounter (Signed)
Received request to fill out FMLA paperwork.  Pt had pacemaker placed 03/2019.    Outreach made to Pt for clarification.  Pt has been stable with new pacemaker with no issues.  Requested reason for FMLA?  Per Pt she received paperwork for her job to renew her FMLA so she turned it in.  Per Pt she has been stable and denies any need for FMLA.  Pt states "just don't fill out the paperwork and it will expire"  Advised Medical Records that no need to fill out paperwork.  No further action needed.

## 2021-02-22 ENCOUNTER — Telehealth: Payer: Self-pay

## 2021-02-22 NOTE — Telephone Encounter (Incomplete)
4/25  Patient's FMLA forms did not need to be completed. Spoke with patient. Patient states she will come by to pick up refund payment of $29 (cash).

## 2021-03-02 ENCOUNTER — Ambulatory Visit (INDEPENDENT_AMBULATORY_CARE_PROVIDER_SITE_OTHER): Payer: Federal, State, Local not specified - PPO

## 2021-03-02 DIAGNOSIS — I442 Atrioventricular block, complete: Secondary | ICD-10-CM

## 2021-03-02 LAB — CUP PACEART REMOTE DEVICE CHECK
Battery Remaining Longevity: 128 mo
Battery Voltage: 3.02 V
Brady Statistic AP VP Percent: 16.33 %
Brady Statistic AP VS Percent: 0.34 %
Brady Statistic AS VP Percent: 23.77 %
Brady Statistic AS VS Percent: 59.56 %
Brady Statistic RA Percent Paced: 16.73 %
Brady Statistic RV Percent Paced: 40.1 %
Date Time Interrogation Session: 20220502212744
Implantable Lead Implant Date: 20200504
Implantable Lead Implant Date: 20200504
Implantable Lead Location: 753859
Implantable Lead Location: 753860
Implantable Lead Model: 3830
Implantable Lead Model: 5076
Implantable Pulse Generator Implant Date: 20200504
Lead Channel Impedance Value: 304 Ohm
Lead Channel Impedance Value: 380 Ohm
Lead Channel Impedance Value: 437 Ohm
Lead Channel Impedance Value: 608 Ohm
Lead Channel Pacing Threshold Amplitude: 0.625 V
Lead Channel Pacing Threshold Amplitude: 1.5 V
Lead Channel Pacing Threshold Pulse Width: 0.4 ms
Lead Channel Pacing Threshold Pulse Width: 0.4 ms
Lead Channel Sensing Intrinsic Amplitude: 16.75 mV
Lead Channel Sensing Intrinsic Amplitude: 16.75 mV
Lead Channel Sensing Intrinsic Amplitude: 4.125 mV
Lead Channel Sensing Intrinsic Amplitude: 4.125 mV
Lead Channel Setting Pacing Amplitude: 1.5 V
Lead Channel Setting Pacing Amplitude: 2.5 V
Lead Channel Setting Pacing Pulse Width: 0.5 ms
Lead Channel Setting Sensing Sensitivity: 1.2 mV

## 2021-03-23 NOTE — Progress Notes (Signed)
Remote pacemaker transmission.   

## 2021-04-22 ENCOUNTER — Ambulatory Visit: Payer: Federal, State, Local not specified - PPO | Admitting: Internal Medicine

## 2021-04-22 ENCOUNTER — Encounter: Payer: Self-pay | Admitting: Internal Medicine

## 2021-04-22 ENCOUNTER — Other Ambulatory Visit: Payer: Self-pay

## 2021-04-22 VITALS — BP 130/90 | HR 74 | Ht 64.0 in | Wt 206.4 lb

## 2021-04-22 DIAGNOSIS — Z950 Presence of cardiac pacemaker: Secondary | ICD-10-CM

## 2021-04-22 DIAGNOSIS — I442 Atrioventricular block, complete: Secondary | ICD-10-CM

## 2021-04-22 DIAGNOSIS — I10 Essential (primary) hypertension: Secondary | ICD-10-CM

## 2021-04-22 NOTE — Patient Instructions (Signed)
Medication Instructions:  Your physician recommends that you continue on your current medications as directed. Please refer to the Current Medication list given to you today.  Labwork: None ordered.  Testing/Procedures: None ordered.  Follow-Up: Your physician wants you to follow-up in: one year with Lewayne Bunting, MD or one of the following Advanced Practice Providers on your designated Care Team:   Gypsy Balsam, NP Francis Dowse, PA-C Casimiro Needle "Mardelle Matte" [place], [place]  Remote monitoring is used to monitor your Pacemaker from home. This monitoring reduces the number of office visits required to check your device to one time per year. It allows [place] to keep an eye on the functioning of your device to ensure it is working properly. You are scheduled for a device check from home on 06/01/2021. You may send your transmission at any time that day. If you have a wireless device, the transmission will be sent automatically. After your physician reviews your transmission, you will receive a postcard with your next transmission date.  Any Other Special Instructions Will Be Listed Below (If Applicable).  If you need a refill on your cardiac medications before your next appointment, please call your pharmacy.

## 2021-04-22 NOTE — Progress Notes (Signed)
HPI [name] returns today for followup. She is a pleasant 55 yo woman with a h/o HTN, DM, high grade heart block, s/p PPM insertion. She denies chest pain or sob. No edema. She has not had syncope since her device was placed. 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 She drinks a lot of fluid all day and c/o having to urinate many times at night while she is trying to sleep. Allergies  Allergen Reactions   Lisinopril Swelling     Current Outpatient Medications  Medication Sig Dispense Refill   amLODipine (NORVASC) 10 MG tablet TAKE 1 TABLET(10 MG) BY MOUTH DAILY 90 tablet 2   Multiple Vitamin (MULTIVITAMIN WITH MINERALS) TABS tablet Take 1 tablet by mouth daily.     No current facility-administered medications for this visit.     Past Medical History:  Diagnosis Date   Hypertension     ROS:   All systems reviewed and negative except as noted in the HPI.   Past Surgical History:  Procedure Laterality Date   CESAREAN SECTION  1990   PACEMAKER IMPLANT N/A 03/04/2019   Procedure: PACEMAKER IMPLANT;  Surgeon: Joy Maw, MD;  [facility];  Service: Cardiovascular;  Laterality: N/A;     Family History  Problem Relation Age of Onset   Hypertension Mother    Diabetes Mellitus II Mother    Dementia Father    Heart disease Other        had pacemaker     Social History   Socioeconomic History   Marital status: Single    Spouse name: Not on file   Number of children: Not on file   Years of education: Not on file   Highest education level: Not on file  Occupational History   Not on file  Tobacco Use   Smoking status: Never   Smokeless tobacco: Never  Vaping Use   Vaping Use: Never used  Substance and Sexual Activity   Alcohol use: Never   Drug use: Never   Sexual activity: Not on file  Other Topics Concern   Not on file  Social History Narrative   Not on file   Social Determinants of Health   Financial Resource  Strain: Not on file  Food Insecurity: Not on file  Transportation Needs: Not on file  Physical Activity: Not on file  Stress: Not on file  Social Connections: Not on file  Intimate Partner Violence: Not on file     BP 130/90   Pulse 74   Ht 5\' 4"  (1.626 m)   Wt 206 lb 6.4 oz (93.6 kg)   LMP 09/23/2019   SpO2 97%   BMI 35.43 kg/m   Physical Exam:  Well appearing NAD HEENT: Unremarkable Neck:  No JVD, no thyromegally Lymphatics:  No adenopathy Back:  No CVA tenderness Lungs:  Clear HEART:  Regular rate rhythm, no murmurs, no rubs, no clicks Abd:  soft, positive bowel sounds, no organomegally, no rebound, no guarding Ext:  2 plus pulses, no edema, no cyanosis, no clubbing Skin:  No rashes no nodules Neuro:  CN II through XII intact, motor grossly intact  EKG - nsr with rbbb  DEVICE  Normal device function.  See PaceArt for details.   Assess/Plan:  Heart block - she is conducting today. She paces in the ventricle 41% of the time.  HTN - her bp is fairly well controlled. She is encouraged to avoid salty foods  and continue her current meds. PPM - her medtronic DDD PM is working normally.    Joy Gowda Bucky Grigg,MD

## 2021-06-01 ENCOUNTER — Ambulatory Visit (INDEPENDENT_AMBULATORY_CARE_PROVIDER_SITE_OTHER): Payer: Federal, State, Local not specified - PPO

## 2021-06-01 DIAGNOSIS — I442 Atrioventricular block, complete: Secondary | ICD-10-CM | POA: Diagnosis not present

## 2021-06-01 LAB — CUP PACEART REMOTE DEVICE CHECK
Battery Remaining Longevity: 124 mo
Battery Voltage: 3 V
Brady Statistic AP VP Percent: 15.97 %
Brady Statistic AP VS Percent: 0.06 %
Brady Statistic AS VP Percent: 55.13 %
Brady Statistic AS VS Percent: 28.84 %
Brady Statistic RA Percent Paced: 16.04 %
Brady Statistic RV Percent Paced: 71.1 %
Date Time Interrogation Session: 20220802055045
Implantable Lead Implant Date: 20200504
Implantable Lead Implant Date: 20200504
Implantable Lead Location: 753859
Implantable Lead Location: 753860
Implantable Lead Model: 3830
Implantable Lead Model: 5076
Implantable Pulse Generator Implant Date: 20200504
Lead Channel Impedance Value: 304 Ohm
Lead Channel Impedance Value: 418 Ohm
Lead Channel Impedance Value: 418 Ohm
Lead Channel Impedance Value: 551 Ohm
Lead Channel Pacing Threshold Amplitude: 0.5 V
Lead Channel Pacing Threshold Amplitude: 1.25 V
Lead Channel Pacing Threshold Pulse Width: 0.4 ms
Lead Channel Pacing Threshold Pulse Width: 0.4 ms
Lead Channel Sensing Intrinsic Amplitude: 15.75 mV
Lead Channel Sensing Intrinsic Amplitude: 15.75 mV
Lead Channel Sensing Intrinsic Amplitude: 4.375 mV
Lead Channel Sensing Intrinsic Amplitude: 4.375 mV
Lead Channel Setting Pacing Amplitude: 1.5 V
Lead Channel Setting Pacing Amplitude: 2.5 V
Lead Channel Setting Pacing Pulse Width: 0.5 ms
Lead Channel Setting Sensing Sensitivity: 1.2 mV

## 2021-06-02 ENCOUNTER — Other Ambulatory Visit: Payer: Self-pay | Admitting: Family Medicine

## 2021-06-02 DIAGNOSIS — Z1231 Encounter for screening mammogram for malignant neoplasm of breast: Secondary | ICD-10-CM

## 2021-06-10 ENCOUNTER — Other Ambulatory Visit: Payer: Self-pay

## 2021-06-10 ENCOUNTER — Ambulatory Visit
Admission: RE | Admit: 2021-06-10 | Discharge: 2021-06-10 | Disposition: A | Discharge status: Home / Self Care | Payer: Federal, State, Local not specified - PPO | Source: Ambulatory Visit | Attending: Family Medicine | Admitting: Family Medicine

## 2021-06-10 DIAGNOSIS — Z1231 Encounter for screening mammogram for malignant neoplasm of breast: Secondary | ICD-10-CM | POA: Diagnosis not present

## 2021-06-25 NOTE — Progress Notes (Signed)
Remote pacemaker transmission.   

## 2021-08-31 ENCOUNTER — Ambulatory Visit (INDEPENDENT_AMBULATORY_CARE_PROVIDER_SITE_OTHER): Payer: Federal, State, Local not specified - PPO

## 2021-08-31 DIAGNOSIS — I442 Atrioventricular block, complete: Secondary | ICD-10-CM

## 2021-08-31 LAB — CUP PACEART REMOTE DEVICE CHECK
Battery Remaining Longevity: 117 mo
Battery Voltage: 3 V
Brady Statistic AP VP Percent: 11.93 %
Brady Statistic AP VS Percent: 0 %
Brady Statistic AS VP Percent: 88.06 %
Brady Statistic AS VS Percent: 0.01 %
Brady Statistic RA Percent Paced: 11.94 %
Brady Statistic RV Percent Paced: 99.99 %
Date Time Interrogation Session: 20221031210152
Implantable Lead Implant Date: 20200504
Implantable Lead Implant Date: 20200504
Implantable Lead Location: 753859
Implantable Lead Location: 753860
Implantable Lead Model: 3830
Implantable Lead Model: 5076
Implantable Pulse Generator Implant Date: 20200504
Lead Channel Impedance Value: 285 Ohm
Lead Channel Impedance Value: 361 Ohm
Lead Channel Impedance Value: 380 Ohm
Lead Channel Impedance Value: 513 Ohm
Lead Channel Pacing Threshold Amplitude: 0.5 V
Lead Channel Pacing Threshold Amplitude: 1.375 V
Lead Channel Pacing Threshold Pulse Width: 0.4 ms
Lead Channel Pacing Threshold Pulse Width: 0.4 ms
Lead Channel Sensing Intrinsic Amplitude: 17.375 mV
Lead Channel Sensing Intrinsic Amplitude: 17.375 mV
Lead Channel Sensing Intrinsic Amplitude: 3 mV
Lead Channel Sensing Intrinsic Amplitude: 3 mV
Lead Channel Setting Pacing Amplitude: 1.5 V
Lead Channel Setting Pacing Amplitude: 2.5 V
Lead Channel Setting Pacing Pulse Width: 0.5 ms
Lead Channel Setting Sensing Sensitivity: 1.2 mV

## 2021-09-07 NOTE — Progress Notes (Signed)
Remote pacemaker transmission.   

## 2021-09-15 ENCOUNTER — Other Ambulatory Visit: Payer: Self-pay | Admitting: Family Medicine

## 2021-10-21 ENCOUNTER — Ambulatory Visit: Payer: Federal, State, Local not specified - PPO | Admitting: Student

## 2021-10-21 ENCOUNTER — Ambulatory Visit (INDEPENDENT_AMBULATORY_CARE_PROVIDER_SITE_OTHER): Payer: Federal, State, Local not specified - PPO

## 2021-10-21 ENCOUNTER — Encounter: Payer: Self-pay | Admitting: Student

## 2021-10-21 ENCOUNTER — Other Ambulatory Visit: Payer: Self-pay

## 2021-10-21 VITALS — BP 134/88 | HR 92 | Ht 64.0 in | Wt 212.8 lb

## 2021-10-21 DIAGNOSIS — Z23 Encounter for immunization: Secondary | ICD-10-CM

## 2021-10-21 DIAGNOSIS — E785 Hyperlipidemia, unspecified: Secondary | ICD-10-CM | POA: Diagnosis not present

## 2021-10-21 DIAGNOSIS — Z1211 Encounter for screening for malignant neoplasm of colon: Secondary | ICD-10-CM

## 2021-10-21 DIAGNOSIS — E669 Obesity, unspecified: Secondary | ICD-10-CM | POA: Diagnosis not present

## 2021-10-21 DIAGNOSIS — R7303 Prediabetes: Secondary | ICD-10-CM

## 2021-10-21 DIAGNOSIS — R7309 Other abnormal glucose: Secondary | ICD-10-CM

## 2021-10-21 DIAGNOSIS — E1169 Type 2 diabetes mellitus with other specified complication: Secondary | ICD-10-CM | POA: Diagnosis not present

## 2021-10-21 DIAGNOSIS — I10 Essential (primary) hypertension: Secondary | ICD-10-CM | POA: Diagnosis not present

## 2021-10-21 DIAGNOSIS — Z0000 Encounter for general adult medical examination without abnormal findings: Secondary | ICD-10-CM

## 2021-10-21 LAB — POCT GLYCOSYLATED HEMOGLOBIN (HGB A1C): HbA1c, POC (prediabetic range): 6.3 % (ref 5.7–6.4)

## 2021-10-21 NOTE — Assessment & Plan Note (Signed)
Takes amlodipine 10mg  daily. Well controlled at this time with goal <140/90.

## 2021-10-21 NOTE — Assessment & Plan Note (Signed)
A1c 6.3%. Highest value was 6.6%. Pt has taken metformin in the past. Discussed restarting should A1c continue to increase. F/u a1c in 6 months. Continue with varied healthy diet and regular exercise.

## 2021-10-21 NOTE — Patient Instructions (Signed)
It was great to see you today! Thank you for choosing [facility] for your primary care. [facility] was seen for general wellness visit.  Our plans for today were:  -Please continue to exercise and eat healthy with a varied diet.  I have placed a colonoscopy referral.  You are up-to-date on your breast cancer and cervical cancer screening.  We are checking your cholesterol and baseline electrolyte levels today. -You are still in the prediabetic range with your hemoglobin A1c of 6.3.  Should this increase any further, we may be discussing diabetic medications in the future. -We have discussed advanced directives booklet today.  Feel free to discuss these matters with your close family and if you decide to fill out the booklet, return to [place] at any time. -Your blood pressure is under control at this time.  I do not feel that there are any changes to make with your blood pressure. -Vaccinations: He received the COVID vaccination today.  Should you be interested in receiving the pneumonia vaccine, please call our office.  We are checking some labs today. If they are abnormal, I will call you. If they are normal, I will send you a MyChart message or a letter. If you do not hear about your labs in the next 2 weeks, please let us know.  You should return to our clinic in 6 months for prediabetes follow-up.   I recommend that you always bring your medications to each appointment as this makes it easy to ensure you are on the correct medications and helps [place] not miss refills when you need them.  Please arrive 15 minutes before your appointment to ensure smooth check in process.  We appreciate your efforts in making this happen.  Take care and seek immediate care sooner if you develop any concerns.   Thank you for allowing me to participate in your care, Shelby Mattocks, DO 10/21/2021, 4:54 PM PGY-1, [facility]

## 2021-10-21 NOTE — Assessment & Plan Note (Signed)
Lipid panel today.   Lipid Panel     Component Value Date/Time   CHOL 170 03/03/2019 0036   TRIG 84 03/03/2019 0036   HDL 59 03/03/2019 0036   CHOLHDL 2.9 03/03/2019 0036   VLDL 17 03/03/2019 0036   LDLCALC 94 03/03/2019 0036

## 2021-10-21 NOTE — Progress Notes (Signed)
° ° °  SUBJECTIVE:   Chief compliant/HPI: annual examination  Joy Leonard is a 55 y.o. who presents today for an annual exam.   Pt does not have any direct concerns to address today. She is requesting COVID vaccination. She lives with her son and daughter in laws. Denies smoking or illicit drug use. Alcohol use seldom. Not currently sexually active. 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  History tabs reviewed and updated.   OBJECTIVE:  BP 134/88    Pulse 92    Ht 5\' 4"  (1.626 m)    Wt 212 lb 12.8 oz (96.5 kg)    LMP 09/23/2019    SpO2 99%    BMI 36.53 kg/m   General: NAD, pleasant, able to participate in exam Cardiac: RRR, no murmurs. Respiratory: CTAB, normal effort Abdomen: Bowel sounds present, nontender, nondistended, no hepatosplenomegaly. Psych: Normal affect and mood   ASSESSMENT/PLAN:  Healthcare maintenance COVID vaccine today. Colonoscopy referral placed. UTD on pap and mammograms. Not interested in STD testing given not sexually active at this time. Given advanced directive booklet. Screening for hyperlipidemia.   Prediabetes A1c 6.3%. Highest value was 6.6%. Pt has taken metformin in the past. Discussed restarting should A1c continue to increase. F/u a1c in 6 months. Continue with varied healthy diet and regular exercise.   Hypertension Takes amlodipine 10mg  daily. Well controlled at this time with goal <140/90.   Annual Examination  See AVS for age appropriate recommendations  PHQ score 1, reviewed and discussed.  BP reviewed and at goal <140/90.  Asked about intimate partner violence and resources given as appropriate  Advance directives discussion, given advanced directives booklet.  Considered the following items based upon USPSTF recommendations: Diabetes screening: ordered Screening for elevated cholesterol: ordered HIV testing: discussed Hepatitis C: discussed Hepatitis B: discussed Syphilis if at high risk:  discussed GC/CT not at high risk and not ordered. Osteoporosis screening considered based upon risk of fracture from [facility] calculator. Major osteoporotic fracture risk is 4%. DEXA not ordered.   Cervical cancer screening: prior Pap reviewed, repeat due in 12/24/2024 Breast cancer screening:  up to date. Receives annually Colorectal cancer screening: discussed, colonoscopy ordered Lung cancer screening: discussed. See documentation below regarding indications/risks/benefits.  Vaccinations: COVID today. To return for PCV-20. Dental: regularly sees dentistry at least twice a year.  Return in about 6 months (around 04/21/2022) for prediabetes follow up.  12/26/2024, DO [facility]    Joy Mattocks, DO 10/21/2021, 11:52 PM PGY-1, [facility]

## 2021-10-21 NOTE — Assessment & Plan Note (Addendum)
COVID vaccine today. Colonoscopy referral placed. UTD on pap and mammograms. Not interested in STD testing given not sexually active at this time. Given advanced directive booklet. Screening for hyperlipidemia.

## 2021-10-22 LAB — BASIC METABOLIC PANEL
BUN/Creatinine Ratio: 15 (ref 9–23)
BUN: 16 mg/dL (ref 6–24)
CO2: 22 mmol/L (ref 20–29)
Calcium: 9.3 mg/dL (ref 8.7–10.2)
Chloride: 108 mmol/L — ABNORMAL HIGH (ref 96–106)
Creatinine, Ser: 1.05 mg/dL — ABNORMAL HIGH (ref 0.57–1.00)
Glucose: 96 mg/dL (ref 70–99)
Potassium: 4.4 mmol/L (ref 3.5–5.2)
Sodium: 142 mmol/L (ref 134–144)
eGFR: 62 mL/min/{1.73_m2} (ref >59)

## 2021-10-22 LAB — LIPID PANEL
Chol/HDL Ratio: 3 ratio (ref 0.0–4.4)
Cholesterol, Total: 191 mg/dL (ref 100–199)
HDL: 63 mg/dL (ref >39)
LDL Chol Calc (NIH): 115 mg/dL — ABNORMAL HIGH (ref 0–99)
Triglycerides: 69 mg/dL (ref 0–149)
VLDL Cholesterol Cal: 13 mg/dL (ref 5–40)

## 2021-11-02 ENCOUNTER — Telehealth: Payer: Self-pay | Admitting: Student

## 2021-11-02 NOTE — Telephone Encounter (Signed)
Spoke with patient about lab results.  Discussed advise for statin medication versus dietary management.  Patient opted for dietary management.  Recheck lipids in 6 months.

## 2021-11-30 ENCOUNTER — Ambulatory Visit (INDEPENDENT_AMBULATORY_CARE_PROVIDER_SITE_OTHER): Payer: Federal, State, Local not specified - PPO

## 2021-11-30 DIAGNOSIS — I442 Atrioventricular block, complete: Secondary | ICD-10-CM | POA: Diagnosis not present

## 2021-11-30 LAB — CUP PACEART REMOTE DEVICE CHECK
Battery Remaining Longevity: 117 mo
Battery Voltage: 3 V
Brady Statistic AP VP Percent: 10.48 %
Brady Statistic AP VS Percent: 0 %
Brady Statistic AS VP Percent: 89.5 %
Brady Statistic AS VS Percent: 0.02 %
Brady Statistic RA Percent Paced: 10.48 %
Brady Statistic RV Percent Paced: 99.98 %
Date Time Interrogation Session: 20230130193212
Implantable Lead Implant Date: 20200504
Implantable Lead Implant Date: 20200504
Implantable Lead Location: 753859
Implantable Lead Location: 753860
Implantable Lead Model: 3830
Implantable Lead Model: 5076
Implantable Pulse Generator Implant Date: 20200504
Lead Channel Impedance Value: 266 Ohm
Lead Channel Impedance Value: 361 Ohm
Lead Channel Impedance Value: 380 Ohm
Lead Channel Impedance Value: 627 Ohm
Lead Channel Pacing Threshold Amplitude: 0.5 V
Lead Channel Pacing Threshold Amplitude: 1.5 V
Lead Channel Pacing Threshold Pulse Width: 0.4 ms
Lead Channel Pacing Threshold Pulse Width: 0.4 ms
Lead Channel Sensing Intrinsic Amplitude: 13.375 mV
Lead Channel Sensing Intrinsic Amplitude: 13.375 mV
Lead Channel Sensing Intrinsic Amplitude: 3.375 mV
Lead Channel Sensing Intrinsic Amplitude: 3.375 mV
Lead Channel Setting Pacing Amplitude: 1.5 V
Lead Channel Setting Pacing Amplitude: 2.5 V
Lead Channel Setting Pacing Pulse Width: 0.5 ms
Lead Channel Setting Sensing Sensitivity: 1.2 mV

## 2021-12-07 NOTE — Progress Notes (Signed)
Remote pacemaker transmission.   

## 2021-12-08 DIAGNOSIS — H40033 Anatomical narrow angle, bilateral: Secondary | ICD-10-CM | POA: Diagnosis not present

## 2021-12-08 DIAGNOSIS — H2513 Age-related nuclear cataract, bilateral: Secondary | ICD-10-CM | POA: Diagnosis not present

## 2022-02-25 ENCOUNTER — Other Ambulatory Visit: Payer: Self-pay | Admitting: Student

## 2022-03-01 ENCOUNTER — Ambulatory Visit (INDEPENDENT_AMBULATORY_CARE_PROVIDER_SITE_OTHER): Payer: Federal, State, Local not specified - PPO

## 2022-03-01 DIAGNOSIS — I442 Atrioventricular block, complete: Secondary | ICD-10-CM

## 2022-03-01 LAB — CUP PACEART REMOTE DEVICE CHECK
Battery Remaining Longevity: 110 mo
Battery Voltage: 2.99 V
Brady Statistic AP VP Percent: 11.37 %
Brady Statistic AP VS Percent: 0 %
Brady Statistic AS VP Percent: 88.62 %
Brady Statistic AS VS Percent: 0.01 %
Brady Statistic RA Percent Paced: 11.37 %
Brady Statistic RV Percent Paced: 99.99 %
Date Time Interrogation Session: 20230502025912
Implantable Lead Implant Date: 20200504
Implantable Lead Implant Date: 20200504
Implantable Lead Location: 753859
Implantable Lead Location: 753860
Implantable Lead Model: 3830
Implantable Lead Model: 5076
Implantable Pulse Generator Implant Date: 20200504
Lead Channel Impedance Value: 285 Ohm
Lead Channel Impedance Value: 361 Ohm
Lead Channel Impedance Value: 399 Ohm
Lead Channel Impedance Value: 570 Ohm
Lead Channel Pacing Threshold Amplitude: 0.5 V
Lead Channel Pacing Threshold Amplitude: 1.375 V
Lead Channel Pacing Threshold Pulse Width: 0.4 ms
Lead Channel Pacing Threshold Pulse Width: 0.4 ms
Lead Channel Sensing Intrinsic Amplitude: 14.5 mV
Lead Channel Sensing Intrinsic Amplitude: 14.5 mV
Lead Channel Sensing Intrinsic Amplitude: 4 mV
Lead Channel Sensing Intrinsic Amplitude: 4 mV
Lead Channel Setting Pacing Amplitude: 1.5 V
Lead Channel Setting Pacing Amplitude: 2.5 V
Lead Channel Setting Pacing Pulse Width: 0.5 ms
Lead Channel Setting Sensing Sensitivity: 1.2 mV

## 2022-03-15 NOTE — Progress Notes (Signed)
Remote pacemaker transmission.   

## 2022-04-05 ENCOUNTER — Encounter: Payer: Self-pay | Admitting: *Deleted

## 2022-04-26 ENCOUNTER — Ambulatory Visit (INDEPENDENT_AMBULATORY_CARE_PROVIDER_SITE_OTHER): Payer: Federal, State, Local not specified - PPO

## 2022-04-26 ENCOUNTER — Encounter (HOSPITAL_COMMUNITY): Payer: Self-pay

## 2022-04-26 ENCOUNTER — Ambulatory Visit (HOSPITAL_COMMUNITY)
Admission: EM | Admit: 2022-04-26 | Discharge: 2022-04-26 | Disposition: A | Discharge status: Home / Self Care | Payer: Federal, State, Local not specified - PPO | Attending: Family Medicine | Admitting: Family Medicine

## 2022-04-26 DIAGNOSIS — M722 Plantar fascial fibromatosis: Secondary | ICD-10-CM | POA: Diagnosis not present

## 2022-04-26 DIAGNOSIS — M79672 Pain in left foot: Secondary | ICD-10-CM

## 2022-04-26 MED ORDER — PREDNISONE 20 MG PO TABS: 40.0000 mg | ORAL_TABLET | Freq: Every day | ORAL | 0 refills | Status: DC

## 2022-04-26 MED ORDER — MELOXICAM 15 MG PO TABS: 15.0000 mg | ORAL_TABLET | Freq: Every day | ORAL | 0 refills | Status: DC

## 2022-04-26 NOTE — ED Provider Notes (Signed)
[facility]   [id number] 04/26/22 Arrival Time: 1402  ASSESSMENT & PLAN:  1. Plantar fasciitis of left foot    I have personally viewed the imaging studies ordered this visit. No bony changes on LEFT foot films.  See AVS for discharge information. Begin: New Prescriptions   MELOXICAM (MOBIC) 15 MG TABLET    Take 1 tablet (15 mg total) by mouth daily.   PREDNISONE (DELTASONE) 20 MG TABLET    Take 2 tablets (40 mg total) by mouth daily.    Recommend:  Follow-up Information     Schedule an appointment as soon as possible for a visit  with [facility]).   Why: If worsening or failing to improve as anticipated. Contact information: [address] [place],  [place]  [address]  [phone]               Reviewed expectations re: course of current medical issues. Questions answered. Outlined signs and symptoms indicating need for more acute intervention. Patient verbalized understanding. After Visit Summary given.  SUBJECTIVE: History from: patient. Joy Leonard is a 56 y.o. 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 No trauma. Pain worse in am. No extremity sensation changes or weakness. No treatment PTA.   Past Surgical History:  Procedure Laterality Date   CESAREAN SECTION  1990   PACEMAKER IMPLANT N/A 03/04/2019   Procedure: PACEMAKER IMPLANT;  Surgeon: Marinus Maw, MD;  [facility];  Service: Cardiovascular;  Laterality: N/A;      OBJECTIVE:  Vitals:   04/26/22 1431  BP: 136/88  Pulse: 94  Resp: 18  Temp: 99.5 F (37.5 C)  TempSrc: Oral  SpO2: 100%    General appearance: alert; no distress HEENT: [place]; AT Neck: supple with FROM Resp: unlabored respirations Extremities: LLE: warm with well perfused appearance; fairly well localized moderate tenderness over left heel at plantar fascia insertion;  without gross deformities; swelling: none; bruising: none CV: brisk extremity capillary refill of LLE; 2+ DP pulse of LLE. Skin: warm and dry; no visible rashes Neurologic: gait normal; normal sensation and strength of LLE Psychological: alert and cooperative; normal mood and affect  Imaging: DG Foot Complete Left  Result Date: 04/26/2022 CLINICAL DATA:  pain > 1 month EXAM: LEFT FOOT - COMPLETE 3+ VIEW COMPARISON:  None Available. FINDINGS: No evidence of fracture, dislocation, or joint effusion. No evidence of severe arthropathy. No aggressive appearing focal bone abnormality. Soft tissues are unremarkable. IMPRESSION: Negative. Electronically Signed   By: Tish Frederickson M.D.   On: 04/26/2022 15:45      Allergies  Allergen Reactions   Lisinopril Swelling    Past Medical History:  Diagnosis Date   Hypertension    Social History   Socioeconomic History   Marital status: Single    Spouse name: Not on file   Number of children: Not on file   Years of education: Not on file   Highest education level: Not on file  Occupational History   Not on file  Tobacco Use   Smoking status: Never   Smokeless tobacco: Never  Vaping Use   Vaping Use: Never used  Substance and Sexual Activity   Alcohol use: Never   Drug use: Never   Sexual activity: Not on file  Other Topics Concern   Not on file  Social History Narrative   Not on file  Social Determinants of Health   Financial Resource Strain: Not on file  Food Insecurity: Not on file  Transportation Needs: Not on file  Physical Activity: Not on file  Stress: Not on file  Social Connections: Somewhat Isolated (03/02/2019)   Social Connection and Isolation Panel [NHANES]    Frequency of Communication with Friends and Family: More than three times a week    Frequency of Social Gatherings with Friends and Family: More than three times a week    Attends Religious Services: More than 4 times per year    Active Member of [employer] or  Organizations: No    Attends Banker Meetings: Never    Marital Status: Divorced   Family History  Problem Relation Age of Onset   Hypertension Mother    Diabetes Mellitus II Mother    Dementia Father    Heart disease Other        had pacemaker   Past Surgical History:  Procedure Laterality Date   CESAREAN SECTION  1990   PACEMAKER IMPLANT N/A 03/04/2019   Procedure: PACEMAKER IMPLANT;  Surgeon: Marinus Maw, MD;  [facility];  Service: Cardiovascular;  Laterality: N/A;       Mardella Layman, MD 04/26/22 1700

## 2022-04-26 NOTE — ED Triage Notes (Signed)
Onset 1-2 month of left heel pain. Pt does a lot of walking at her job. No injuries or falls.

## 2022-05-31 ENCOUNTER — Ambulatory Visit (INDEPENDENT_AMBULATORY_CARE_PROVIDER_SITE_OTHER): Payer: Federal, State, Local not specified - PPO

## 2022-05-31 DIAGNOSIS — I442 Atrioventricular block, complete: Secondary | ICD-10-CM | POA: Diagnosis not present

## 2022-05-31 LAB — CUP PACEART REMOTE DEVICE CHECK
Battery Remaining Longevity: 104 mo
Battery Voltage: 2.99 V
Brady Statistic AP VP Percent: 13.61 %
Brady Statistic AP VS Percent: 0 %
Brady Statistic AS VP Percent: 86.37 %
Brady Statistic AS VS Percent: 0.02 %
Brady Statistic RA Percent Paced: 13.62 %
Brady Statistic RV Percent Paced: 99.98 %
Date Time Interrogation Session: 20230801000100
Implantable Lead Implant Date: 20200504
Implantable Lead Implant Date: 20200504
Implantable Lead Location: 753859
Implantable Lead Location: 753860
Implantable Lead Model: 3830
Implantable Lead Model: 5076
Implantable Pulse Generator Implant Date: 20200504
Lead Channel Impedance Value: 285 Ohm
Lead Channel Impedance Value: 361 Ohm
Lead Channel Impedance Value: 380 Ohm
Lead Channel Impedance Value: 532 Ohm
Lead Channel Pacing Threshold Amplitude: 0.625 V
Lead Channel Pacing Threshold Amplitude: 1.375 V
Lead Channel Pacing Threshold Pulse Width: 0.4 ms
Lead Channel Pacing Threshold Pulse Width: 0.4 ms
Lead Channel Sensing Intrinsic Amplitude: 14 mV
Lead Channel Sensing Intrinsic Amplitude: 14 mV
Lead Channel Sensing Intrinsic Amplitude: 3.5 mV
Lead Channel Sensing Intrinsic Amplitude: 3.5 mV
Lead Channel Setting Pacing Amplitude: 1.5 V
Lead Channel Setting Pacing Amplitude: 2.5 V
Lead Channel Setting Pacing Pulse Width: 0.5 ms
Lead Channel Setting Sensing Sensitivity: 1.2 mV

## 2022-06-07 ENCOUNTER — Ambulatory Visit: Payer: Self-pay | Admitting: Licensed Clinical Social Worker

## 2022-06-07 NOTE — Patient Outreach (Signed)
  Care Coordination   Initial Visit Note   06/07/2022 Name: Davona Kinoshita MRN: [id number] DOB: August 16, 1966  [address] Allisen Pidgeon is a 56 y.o. year old female who sees Shelby Mattocks, DO for primary care. I spoke with  [facility] by phone today  What matters to the patients health and wellness today?  Successful call to patient. SW addressed all patients concerns. SDOH and Needs assessment completed. Patient has no additional needs. SW removed self from care team. Patient understands to reach out to clinic if further assistance is needed.     SDOH assessments and interventions completed:  Yes     Care Coordination Interventions Activated:  Yes  Care Coordination Interventions:  Yes, provided   Follow up plan: No further intervention required.   Encounter Outcome:  Pt. Visit Completed   Ander Gaster, MSW  Social Worker [facility]  [phone]

## 2022-06-07 NOTE — Patient Instructions (Signed)
Visit Information  Instructions:   Patient was given the following information about care management and care coordination services today, agreed to services, and gave verbal consent: 1.care management/care coordination services include personalized support from designated clinical staff supervised by their physician, including individualized plan of care and coordination with other care providers 2. 24/7 contact phone numbers for assistance for urgent and routine care needs. 3. The patient may stop care management/care coordination services at any time by phone call to the office staff.  Patient verbalizes understanding of instructions and care plan provided today and agrees to view in MyChart. Active MyChart status and patient understanding of how to access instructions and care plan via MyChart confirmed with patient.     No further follow up required: .  Caitriona Sundquist, BSW , MSW Social Worker [facility]  [phone]      

## 2022-06-14 ENCOUNTER — Other Ambulatory Visit: Payer: Self-pay | Admitting: Student

## 2022-06-14 DIAGNOSIS — Z1231 Encounter for screening mammogram for malignant neoplasm of breast: Secondary | ICD-10-CM

## 2022-06-21 DIAGNOSIS — Z01419 Encounter for gynecological examination (general) (routine) without abnormal findings: Secondary | ICD-10-CM | POA: Diagnosis not present

## 2022-06-21 DIAGNOSIS — Z6836 Body mass index (BMI) 36.0-36.9, adult: Secondary | ICD-10-CM | POA: Diagnosis not present

## 2022-06-28 NOTE — Progress Notes (Signed)
Remote pacemaker transmission.   

## 2022-06-29 ENCOUNTER — Ambulatory Visit
Admission: RE | Admit: 2022-06-29 | Discharge: 2022-06-29 | Disposition: A | Discharge status: Home / Self Care | Payer: Federal, State, Local not specified - PPO | Source: Ambulatory Visit | Attending: Family Medicine | Admitting: Family Medicine

## 2022-06-29 DIAGNOSIS — Z1231 Encounter for screening mammogram for malignant neoplasm of breast: Secondary | ICD-10-CM

## 2022-08-18 ENCOUNTER — Ambulatory Visit: Payer: Federal, State, Local not specified - PPO | Attending: Internal Medicine | Admitting: Internal Medicine

## 2022-08-18 ENCOUNTER — Encounter: Payer: Self-pay | Admitting: Internal Medicine

## 2022-08-18 VITALS — BP 126/80 | HR 85 | Ht 64.0 in | Wt 216.4 lb

## 2022-08-18 DIAGNOSIS — Z950 Presence of cardiac pacemaker: Secondary | ICD-10-CM

## 2022-08-18 DIAGNOSIS — I442 Atrioventricular block, complete: Secondary | ICD-10-CM | POA: Diagnosis not present

## 2022-08-18 DIAGNOSIS — I10 Essential (primary) hypertension: Secondary | ICD-10-CM

## 2022-08-18 LAB — CUP PACEART INCLINIC DEVICE CHECK
Battery Remaining Longevity: 101 mo
Battery Voltage: 2.98 V
Brady Statistic AP VP Percent: 12.79 %
Brady Statistic AP VS Percent: 0.01 %
Brady Statistic AS VP Percent: 84.81 %
Brady Statistic AS VS Percent: 2.39 %
Brady Statistic RA Percent Paced: 12.8 %
Brady Statistic RV Percent Paced: 97.6 %
Date Time Interrogation Session: 20231019163331
Implantable Lead Implant Date: 20200504
Implantable Lead Implant Date: 20200504
Implantable Lead Location: 753859
Implantable Lead Location: 753860
Implantable Lead Model: 3830
Implantable Lead Model: 5076
Implantable Pulse Generator Implant Date: 20200504
Lead Channel Impedance Value: 304 Ohm
Lead Channel Impedance Value: 380 Ohm
Lead Channel Impedance Value: 418 Ohm
Lead Channel Impedance Value: 532 Ohm
Lead Channel Pacing Threshold Amplitude: 0.625 V
Lead Channel Pacing Threshold Amplitude: 1.25 V
Lead Channel Pacing Threshold Pulse Width: 0.4 ms
Lead Channel Pacing Threshold Pulse Width: 0.4 ms
Lead Channel Sensing Intrinsic Amplitude: 12.75 mV
Lead Channel Sensing Intrinsic Amplitude: 15.625 mV
Lead Channel Sensing Intrinsic Amplitude: 3.875 mV
Lead Channel Sensing Intrinsic Amplitude: 4.625 mV
Lead Channel Setting Pacing Amplitude: 1.5 V
Lead Channel Setting Pacing Amplitude: 2.5 V
Lead Channel Setting Pacing Pulse Width: 0.5 ms
Lead Channel Setting Sensing Sensitivity: 1.2 mV

## 2022-08-18 NOTE — Progress Notes (Signed)
HPI Mrs. [address] [name] returns today for followup. She is a pleasant 56 yo woman with a h/o HTN, DM, high grade heart block, s/p PPM insertion. She denies chest pain or sob. No edema. She has not had syncope since her device was placed. 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 She drinks a lot of fluid all day and c/o having to urinate many times at night while she is trying to sleep. Allergies  Allergen Reactions   Lisinopril Swelling     Current Outpatient Medications  Medication Sig Dispense Refill   amLODipine (NORVASC) 10 MG tablet TAKE 1 TABLET BY MOUTH EVERY DAY 60 tablet 3   Multiple Vitamin (MULTIVITAMIN WITH MINERALS) TABS tablet Take 1 tablet by mouth daily.     No current facility-administered medications for this visit.     Past Medical History:  Diagnosis Date   Hypertension     ROS:   All systems reviewed and negative except as noted in the HPI.   Past Surgical History:  Procedure Laterality Date   CESAREAN SECTION  1990   PACEMAKER IMPLANT N/A 03/04/2019   Procedure: PACEMAKER IMPLANT;  Surgeon: Evans Lance, MD;  Location: [facility];  Service: Cardiovascular;  Laterality: N/A;     Family History  Problem Relation Age of Onset   Hypertension Mother    Diabetes Mellitus II Mother    Dementia Father    Heart disease Other        had pacemaker   Breast cancer Neg Hx      Social History   Socioeconomic History   Marital status: Single    Spouse name: Not on file   Number of children: Not on file   Years of education: Not on file   Highest education level: Not on file  Occupational History   Not on file  Tobacco Use   Smoking status: Never   Smokeless tobacco: Never  Vaping Use   Vaping Use: Never used  Substance and Sexual Activity   Alcohol use: Never   Drug use: Never   Sexual activity: Not on file  Other Topics Concern   Not on file  Social History Narrative   Not on file   Social Determinants of Health    Financial Resource Strain: Low Risk  (03/02/2019)   Overall Financial Resource Strain (CARDIA)    Difficulty of Paying Living Expenses: Not hard at all  Food Insecurity: No Food Insecurity (03/02/2019)   Hunger Vital Sign    Worried About Running Out of Food in the Last Year: Never true    [place] in the Last Year: Never true  Transportation Needs: No Transportation Needs (03/02/2019)   PRAPARE - Hydrologist (Medical): No    Lack of Transportation (Non-Medical): No  Physical Activity: Sufficiently Active (03/02/2019)   Exercise Vital Sign    Days of Exercise per Week: 5 days    Minutes of Exercise per Session: 100 min  Stress: No Stress Concern Present (03/02/2019)   [place]    Feeling of Stress : Not at all  Social Connections: Somewhat Isolated (03/02/2019)   Social Connection and Isolation Panel [NHANES]    Frequency of Communication with Friends and Family: More than three times a week    Frequency of Social Gatherings with Friends and Family: More than three times a week    Attends Religious  Services: More than 4 times per year    Active Member of Clubs or Organizations: No    Attends Archivist Meetings: Never    Marital Status: Divorced  Human resources officer Violence: Not At Risk (03/02/2019)   Humiliation, Afraid, Rape, and Kick questionnaire    Fear of Current or Ex-Partner: No    Emotionally Abused: No    Physically Abused: No    Sexually Abused: No     BP 126/80   Pulse 85   Ht 5\' 4"  (1.626 m)   Wt 216 lb 6.4 oz (98.2 kg)   LMP 09/23/2019   SpO2 99%   BMI 37.14 kg/m   Physical Exam:  Well appearing NAD HEENT: Unremarkable Neck:  No JVD, no thyromegally Lymphatics:  No adenopathy Back:  No CVA tenderness Lungs:  Clear with no wheezes HEART:  Regular rate rhythm, no murmurs, no rubs, no clicks Abd:  soft, positive bowel sounds, no organomegally, no  rebound, no guarding Ext:  2 plus pulses, no edema, no cyanosis, no clubbing Skin:  No rashes no nodules Neuro:  CN II through XII intact, motor grossly intact  EKG - nsr with ventricular pacing  DEVICE  Normal device function.  See PaceArt for details.   Assess/Plan:   Heart block - she is in AV block today. She paces in the ventricle most of the time.  HTN - her bp is fairly well controlled. She is encouraged to avoid salty foods and continue her current meds. PPM - her medtronic DDD PM is working normally.      Carleene Overlie Mayumi Summerson,MD

## 2022-08-18 NOTE — Patient Instructions (Signed)
Medication Instructions:  Your physician recommends that you continue on your current medications as directed. Please refer to the Current Medication list given to you today.  *If you need a refill on your cardiac medications before your next appointment, please call your pharmacy*  Lab Work: None ordered.  If you have labs (blood work) drawn today and your tests are completely normal, you will receive your results only by: [place] (if you have MyChart) OR A paper copy in the mail If you have any lab test that is abnormal or we need to change your treatment, we will call you to review the results.  Testing/Procedures: None ordered.  Follow-Up: At [facility], you and your health needs are our priority.  As part of our continuing mission to provide you with exceptional heart care, we have created designated Provider Care Teams.  These Care Teams include your primary Cardiologist (physician) and Advanced Practice Providers (APPs -  Physician Assistants and Nurse Practitioners) who all work together to provide you with the care you need, when you need it.  We recommend signing up for the patient portal called "MyChart".  Sign up information is provided on this After Visit Summary.  MyChart is used to connect with patients for Virtual Visits (Telemedicine).  Patients are able to view lab/test results, encounter notes, upcoming appointments, etc.  Non-urgent messages can be sent to your provider as well.   To learn more about what you can do with MyChart, go to [email or web address].    Your next appointment:   1 year(s)  The format for your next appointment:   In Person  Provider:   Cristopher Peru, MD{or one of the following Advanced Practice Providers on your designated Care Team:   Tommye Standard, [place] Legrand Como "Jonni Sanger" Chalmers Cater, [place]  Remote monitoring is used to monitor your Pacemaker from home. This monitoring reduces the number of office visits required to check your device to  one time per year. It allows [place] to keep an eye on the functioning of your device to ensure it is working properly. You are scheduled for a device check from home on 08/30/22. You may send your transmission at any time that day. If you have a wireless device, the transmission will be sent automatically. After your physician reviews your transmission, you will receive a postcard with your next transmission date.  Important Information About [place]

## 2022-09-23 ENCOUNTER — Ambulatory Visit (HOSPITAL_COMMUNITY)
Admission: EM | Admit: 2022-09-23 | Discharge: 2022-09-23 | Disposition: A | Discharge status: Home / Self Care | Attending: Internal Medicine | Admitting: Internal Medicine

## 2022-09-23 ENCOUNTER — Encounter (HOSPITAL_COMMUNITY): Payer: Self-pay | Admitting: Emergency Medicine

## 2022-09-23 DIAGNOSIS — S67196A Crushing injury of right little finger, initial encounter: Secondary | ICD-10-CM | POA: Diagnosis not present

## 2022-09-23 DIAGNOSIS — S6991XA Unspecified injury of right wrist, hand and finger(s), initial encounter: Secondary | ICD-10-CM

## 2022-09-23 DIAGNOSIS — S6710XA Crushing injury of unspecified finger(s), initial encounter: Secondary | ICD-10-CM

## 2022-09-23 HISTORY — DX: Prediabetes: R73.03

## 2022-09-23 MED ORDER — TETANUS-DIPHTH-ACELL PERTUSSIS 5-2.5-18.5 LF-MCG/0.5 IM SUSY
PREFILLED_SYRINGE | INTRAMUSCULAR | Status: AC
  Filled 2022-09-23: qty 0.5

## 2022-09-23 MED ORDER — TETANUS-DIPHTH-ACELL PERTUSSIS 5-2.5-18.5 LF-MCG/0.5 IM SUSY
0.5000 mL | PREFILLED_SYRINGE | Freq: Once | INTRAMUSCULAR | Status: AC
Start: 2022-09-23 — End: 2022-09-23
  Administered 2022-09-23: 0.5 mL via INTRAMUSCULAR

## 2022-09-23 NOTE — ED Triage Notes (Signed)
Pt shut right 5th finger in car door. Unknown last tetanus.

## 2022-09-23 NOTE — ED Provider Notes (Signed)
[facility]    CSN: [id number] Arrival date & time: 09/23/22  1619      History   Chief Complaint Chief Complaint  Patient presents with   Finger Injury    HPI Joy Leonard is a 56 y.o. female.   Patient presents urgent care for evaluation of injury to the right pinky finger that happened while she was at work today.  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  This caused a lot of bleeding without damage to the nail.  There is disruption of the skin and abrasion surrounding the nail of the right pinky finger without laceration.  Unknown date of last tetanus injection.  No numbness or tingling distal to injury.       Past Medical History:  Diagnosis Date   Borderline diabetes    Hypertension     Patient Active Problem List   Diagnosis Date Noted   Pacemaker 06/05/2019   Hypertension 04/20/2019   Menopause 04/20/2019   Alpha thalassaemia minor 04/20/2019   Healthcare maintenance 04/20/2019   Prediabetes 04/20/2019   Complete heart block (HCC) 03/02/2019    Past Surgical History:  Procedure Laterality Date   CESAREAN SECTION  1990   PACEMAKER IMPLANT N/A 03/04/2019   Procedure: PACEMAKER IMPLANT;  Surgeon: Marinus Maw, MD;  [facility];  Service: Cardiovascular;  Laterality: N/A;    OB History   No obstetric history on file.      Home Medications    Prior to Admission medications   Medication Sig Start Date End Date Taking? Authorizing Provider  amLODipine (NORVASC) 10 MG tablet TAKE 1 TABLET BY MOUTH EVERY DAY 02/25/22   Shelby Mattocks, DO  Multiple Vitamin (MULTIVITAMIN WITH MINERALS) TABS tablet Take 1 tablet by mouth daily.    [provider]    Family History Family History  Problem Relation Age of Onset   Hypertension Mother    Diabetes Mellitus II Mother    Dementia Father    Heart disease Other         had pacemaker   Breast cancer Neg Hx     Social History Social History   Tobacco Use   Smoking status: Never   Smokeless tobacco: Never  Vaping Use   Vaping Use: Never used  Substance Use Topics   Alcohol use: Never   Drug use: Never     Allergies   Lisinopril   Review of Systems Review of Systems Per HPI  Physical Exam Triage Vital Signs ED Triage Vitals  Enc Vitals Group     BP 09/23/22 1716 (!) 145/93     Pulse Rate 09/23/22 1716 94     Resp 09/23/22 1716 18     Temp 09/23/22 1716 97.9 F (36.6 C)     Temp Source 09/23/22 1716 Oral     SpO2 09/23/22 1716 100 %     Weight --      Height --      Head Circumference --      Peak Flow --      Pain Score 09/23/22 1715 8     Pain Loc --      Pain Edu? --      Excl. in GC? --    No data found.  Updated Vital Signs BP (!) 145/93 (BP Location: Left Arm)   Pulse 94  Temp 97.9 F (36.6 C) (Oral)   Resp 18   LMP 09/23/2019   SpO2 100%   Visual Acuity Right Eye Distance:   Left Eye Distance:   Bilateral Distance:    Right Eye Near:   Left Eye Near:    Bilateral Near:     Physical Exam Vitals and nursing note reviewed.  Constitutional:      Appearance: She is not ill-appearing or toxic-appearing.  HENT:     Head: Normocephalic and atraumatic.     Right Ear: Hearing and external ear normal.     Left Ear: Hearing and external ear normal.     Nose: Nose normal.     Mouth/Throat:     Lips: Pink.  Eyes:     General: Lids are normal. Vision grossly intact. Gaze aligned appropriately.     Extraocular Movements: Extraocular movements intact.     Conjunctiva/sclera: Conjunctivae normal.  Pulmonary:     Effort: Pulmonary effort is normal.  Musculoskeletal:     Cervical back: Neck supple.  Skin:    General: Skin is warm and dry.     Capillary Refill: Capillary refill takes less than 2 seconds.     Findings: Abrasion and wound present. No rash.     Comments: Abrasion to the distal right pinky finger  as seen in images below.  Nail is intact.  Capillary refill to affected digit is less than 3.  Strength and sensation intact to affected digit.  +2 right radial pulse.  Neurological:     General: No focal deficit present.     Mental Status: She is alert and oriented to person, place, and time. Mental status is at baseline.     Cranial Nerves: No dysarthria or facial asymmetry.  Psychiatric:        Mood and Affect: Mood normal.        Speech: Speech normal.        Behavior: Behavior normal.        Thought Content: Thought content normal.        Judgment: Judgment normal.         UC Treatments / Results  Labs (all labs ordered are listed, but only abnormal results are displayed) Labs Reviewed - No data to display  EKG   Radiology No results found.  Procedures Procedures (including critical care time)  Medications Ordered in UC Medications  Tdap (BOOSTRIX) injection 0.5 mL (0.5 mLs Intramuscular Given 09/23/22 1810)    Initial Impression / Assessment and Plan / UC Course  I have reviewed the triage vital signs and the nursing notes.  Pertinent labs & imaging results that were available during my care of the patient were reviewed by me and considered in my medical decision making (see chart for details).   1.  Crushing injury of finger of right hand Wound cleansed and dressed in clinic with Hibiclens and bacitracin with nonstick gauze and Coban wrap.  Discussed return precautions including signs of infection to which patient expresses understanding and agreement with plan.  Tetanus updated in clinic.  She is to change the dressing twice daily and keep the wound covered while at work to prevent infection. May apply over the counter ointment to the wound to promote proper healing and keep the wound bed moist.   Discussed physical exam and available lab work findings in clinic with patient.  Counseled patient regarding appropriate use of medications and potential side effects  for all medications recommended or prescribed today. Discussed red  flag signs and symptoms of worsening condition,when to call the PCP office, return to urgent care, and when to seek higher level of care in the emergency department. Patient verbalizes understanding and agreement with plan. All questions answered. Patient discharged in stable condition.    Final Clinical Impressions(s) / UC Diagnoses   Final diagnoses:  Injury of finger of right hand, initial encounter  Crushing injury of finger, initial encounter     Discharge Instructions      We updated your tetanus shot in the clinic. Change her dressing twice daily for the next few days as the wound heals. If you notice any redness, swelling, pus from the site, or worsening pain, please return to urgent care for reevaluation.  You may apply over-the-counter ointment to the wound twice daily to keep it moist and healing appropriately.  Work note is at the end of your packet.   If you develop any new or worsening symptoms or do not improve in the next 2 to 3 days, please return.  If your symptoms are severe, please go to the emergency room.  Follow-up with your primary care provider for further evaluation and management of your symptoms as well as ongoing wellness visits.  I hope you feel better!     ED Prescriptions   None    PDMP not reviewed this encounter.   Carlisle Beers, [place] 09/23/22 1818

## 2022-09-23 NOTE — Discharge Instructions (Signed)
We updated your tetanus shot in the clinic. Change her dressing twice daily for the next few days as the wound heals. If you notice any redness, swelling, pus from the site, or worsening pain, please return to urgent care for reevaluation.  You may apply over-the-counter ointment to the wound twice daily to keep it moist and healing appropriately.  Work note is at the end of your packet.   If you develop any new or worsening symptoms or do not improve in the next 2 to 3 days, please return.  If your symptoms are severe, please go to the emergency room.  Follow-up with your primary care provider for further evaluation and management of your symptoms as well as ongoing wellness visits.  I hope you feel better!

## 2022-09-29 ENCOUNTER — Ambulatory Visit (INDEPENDENT_AMBULATORY_CARE_PROVIDER_SITE_OTHER): Payer: Federal, State, Local not specified - PPO

## 2022-09-29 DIAGNOSIS — I442 Atrioventricular block, complete: Secondary | ICD-10-CM | POA: Diagnosis not present

## 2022-09-29 LAB — CUP PACEART REMOTE DEVICE CHECK
Battery Remaining Longevity: 95 mo
Battery Voltage: 2.98 V
Brady Statistic AP VP Percent: 16.86 %
Brady Statistic AP VS Percent: 0 %
Brady Statistic AS VP Percent: 82.76 %
Brady Statistic AS VS Percent: 0.38 %
Brady Statistic RA Percent Paced: 17 %
Brady Statistic RV Percent Paced: 99.62 %
Date Time Interrogation Session: 20231130000151
Implantable Lead Connection Status: 753985
Implantable Lead Connection Status: 753985
Implantable Lead Implant Date: 20200504
Implantable Lead Implant Date: 20200504
Implantable Lead Location: 753859
Implantable Lead Location: 753860
Implantable Lead Model: 3830
Implantable Lead Model: 5076
Implantable Pulse Generator Implant Date: 20200504
Lead Channel Impedance Value: 266 Ohm
Lead Channel Impedance Value: 323 Ohm
Lead Channel Impedance Value: 342 Ohm
Lead Channel Impedance Value: 456 Ohm
Lead Channel Pacing Threshold Amplitude: 0.5 V
Lead Channel Pacing Threshold Amplitude: 1.5 V
Lead Channel Pacing Threshold Pulse Width: 0.4 ms
Lead Channel Pacing Threshold Pulse Width: 0.4 ms
Lead Channel Sensing Intrinsic Amplitude: 13.125 mV
Lead Channel Sensing Intrinsic Amplitude: 13.125 mV
Lead Channel Sensing Intrinsic Amplitude: 3.875 mV
Lead Channel Sensing Intrinsic Amplitude: 3.875 mV
Lead Channel Setting Pacing Amplitude: 1.5 V
Lead Channel Setting Pacing Amplitude: 2.5 V
Lead Channel Setting Pacing Pulse Width: 0.5 ms
Lead Channel Setting Sensing Sensitivity: 1.2 mV
Zone Setting Status: 755011
Zone Setting Status: 755011

## 2022-10-03 ENCOUNTER — Telehealth: Payer: Self-pay

## 2022-10-03 ENCOUNTER — Ambulatory Visit (INDEPENDENT_AMBULATORY_CARE_PROVIDER_SITE_OTHER): Payer: Federal, State, Local not specified - PPO

## 2022-10-03 DIAGNOSIS — I442 Atrioventricular block, complete: Secondary | ICD-10-CM

## 2022-10-03 LAB — CUP PACEART REMOTE DEVICE CHECK
Battery Remaining Longevity: 97 mo
Battery Voltage: 2.98 V
Brady Statistic AP VP Percent: 20.27 %
Brady Statistic AP VS Percent: 0 %
Brady Statistic AS VP Percent: 78.79 %
Brady Statistic AS VS Percent: 0.94 %
Brady Statistic RA Percent Paced: 20.82 %
Brady Statistic RV Percent Paced: 99.06 %
Date Time Interrogation Session: 20231203194616
Implantable Lead Connection Status: 753985
Implantable Lead Connection Status: 753985
Implantable Lead Implant Date: 20200504
Implantable Lead Implant Date: 20200504
Implantable Lead Location: 753859
Implantable Lead Location: 753860
Implantable Lead Model: 3830
Implantable Lead Model: 5076
Implantable Pulse Generator Implant Date: 20200504
Lead Channel Impedance Value: 285 Ohm
Lead Channel Impedance Value: 361 Ohm
Lead Channel Impedance Value: 361 Ohm
Lead Channel Impedance Value: 494 Ohm
Lead Channel Pacing Threshold Amplitude: 0.5 V
Lead Channel Pacing Threshold Amplitude: 1.5 V
Lead Channel Pacing Threshold Pulse Width: 0.4 ms
Lead Channel Pacing Threshold Pulse Width: 0.4 ms
Lead Channel Sensing Intrinsic Amplitude: 13.125 mV
Lead Channel Sensing Intrinsic Amplitude: 13.125 mV
Lead Channel Sensing Intrinsic Amplitude: 3.5 mV
Lead Channel Sensing Intrinsic Amplitude: 3.5 mV
Lead Channel Setting Pacing Amplitude: 1.5 V
Lead Channel Setting Pacing Amplitude: 2.5 V
Lead Channel Setting Pacing Pulse Width: 0.5 ms
Lead Channel Setting Sensing Sensitivity: 1.2 mV
Zone Setting Status: 755011
Zone Setting Status: 755011

## 2022-10-03 NOTE — Telephone Encounter (Signed)
Device Clinic apt made for 10/06/22.

## 2022-10-03 NOTE — Telephone Encounter (Signed)
Following alert received from [employer] received for Threshold remains at high output.

## 2022-10-06 ENCOUNTER — Ambulatory Visit: Payer: Federal, State, Local not specified - PPO | Attending: Internal Medicine

## 2022-10-06 DIAGNOSIS — Z950 Presence of cardiac pacemaker: Secondary | ICD-10-CM

## 2022-10-06 DIAGNOSIS — I442 Atrioventricular block, complete: Secondary | ICD-10-CM | POA: Diagnosis not present

## 2022-10-06 LAB — CUP PACEART INCLINIC DEVICE CHECK
Battery Remaining Longevity: 77 mo
Battery Voltage: 2.98 V
Brady Statistic AP VP Percent: 17.34 %
Brady Statistic AP VS Percent: 0 %
Brady Statistic AS VP Percent: 82.17 %
Brady Statistic AS VS Percent: 0.49 %
Brady Statistic RA Percent Paced: 17.52 %
Brady Statistic RV Percent Paced: 99.51 %
Date Time Interrogation Session: 20231207100837
Implantable Lead Connection Status: 753985
Implantable Lead Connection Status: 753985
Implantable Lead Implant Date: 20200504
Implantable Lead Implant Date: 20200504
Implantable Lead Location: 753859
Implantable Lead Location: 753860
Implantable Lead Model: 3830
Implantable Lead Model: 5076
Implantable Pulse Generator Implant Date: 20200504
Lead Channel Impedance Value: 285 Ohm
Lead Channel Impedance Value: 342 Ohm
Lead Channel Impedance Value: 399 Ohm
Lead Channel Impedance Value: 494 Ohm
Lead Channel Pacing Threshold Amplitude: 0.5 V
Lead Channel Pacing Threshold Amplitude: 2 V
Lead Channel Pacing Threshold Amplitude: 2.5 V
Lead Channel Pacing Threshold Pulse Width: 0.4 ms
Lead Channel Pacing Threshold Pulse Width: 0.4 ms
Lead Channel Pacing Threshold Pulse Width: 0.5 ms
Lead Channel Sensing Intrinsic Amplitude: 13.125 mV
Lead Channel Sensing Intrinsic Amplitude: 13.125 mV
Lead Channel Sensing Intrinsic Amplitude: 3.75 mV
Lead Channel Sensing Intrinsic Amplitude: 4.125 mV
Lead Channel Setting Pacing Amplitude: 1.5 V
Lead Channel Setting Pacing Amplitude: 2.5 V
Lead Channel Setting Pacing Pulse Width: 0.8 ms
Lead Channel Setting Sensing Sensitivity: 1.2 mV
Zone Setting Status: 755011
Zone Setting Status: 755011

## 2022-10-06 NOTE — Patient Instructions (Signed)
Follow up per recall. 

## 2022-10-06 NOTE — Progress Notes (Signed)
In clinic device check for alert received for RV threshold at high output.  RV threshold tested at 2V at 0.5 ms.  Previous check threshold 1.25 V at 0.5 ms. Outreach made to Dr. Ladona Ridgel. Tested RV threshold bipolar at 0.8  ms with loss at 1.5V.  Tested RV threshold unipolar at 0.8 ms with loss at 1.25V. Per Dr. Harl Favor RV output to 2.5V at 0.8 ms unipolar with autocapture programmed off. Changes made.

## 2022-10-18 NOTE — Progress Notes (Signed)
Remote pacemaker transmission.   

## 2022-11-09 NOTE — Progress Notes (Signed)
Remote pacemaker transmission.   

## 2022-12-29 ENCOUNTER — Ambulatory Visit: Payer: Federal, State, Local not specified - PPO

## 2022-12-29 DIAGNOSIS — I442 Atrioventricular block, complete: Secondary | ICD-10-CM

## 2022-12-29 LAB — CUP PACEART REMOTE DEVICE CHECK
Battery Remaining Longevity: 71 mo
Battery Voltage: 2.97 V
Brady Statistic AP VP Percent: 15.79 %
Brady Statistic AP VS Percent: 0.01 %
Brady Statistic AS VP Percent: 84.05 %
Brady Statistic AS VS Percent: 0.15 %
Brady Statistic RA Percent Paced: 15.83 %
Brady Statistic RV Percent Paced: 99.85 %
Date Time Interrogation Session: 20240228203352
Implantable Lead Connection Status: 753985
Implantable Lead Connection Status: 753985
Implantable Lead Implant Date: 20200504
Implantable Lead Implant Date: 20200504
Implantable Lead Location: 753859
Implantable Lead Location: 753860
Implantable Lead Model: 3830
Implantable Lead Model: 5076
Implantable Pulse Generator Implant Date: 20200504
Lead Channel Impedance Value: 285 Ohm
Lead Channel Impedance Value: 361 Ohm
Lead Channel Impedance Value: 399 Ohm
Lead Channel Impedance Value: 513 Ohm
Lead Channel Pacing Threshold Amplitude: 0.5 V
Lead Channel Pacing Threshold Amplitude: 2.5 V
Lead Channel Pacing Threshold Pulse Width: 0.4 ms
Lead Channel Pacing Threshold Pulse Width: 0.4 ms
Lead Channel Sensing Intrinsic Amplitude: 19.5 mV
Lead Channel Sensing Intrinsic Amplitude: 19.5 mV
Lead Channel Sensing Intrinsic Amplitude: 4.25 mV
Lead Channel Sensing Intrinsic Amplitude: 4.25 mV
Lead Channel Setting Pacing Amplitude: 1.5 V
Lead Channel Setting Pacing Amplitude: 2.5 V
Lead Channel Setting Pacing Pulse Width: 0.8 ms
Lead Channel Setting Sensing Sensitivity: 1.2 mV
Zone Setting Status: 755011
Zone Setting Status: 755011

## 2023-01-17 ENCOUNTER — Telehealth: Payer: Self-pay | Admitting: *Deleted

## 2023-01-17 DIAGNOSIS — Z86010 Personal history of colonic polyps: Secondary | ICD-10-CM | POA: Diagnosis not present

## 2023-01-17 DIAGNOSIS — Z1211 Encounter for screening for malignant neoplasm of colon: Secondary | ICD-10-CM | POA: Diagnosis not present

## 2023-01-17 DIAGNOSIS — I10 Essential (primary) hypertension: Secondary | ICD-10-CM | POA: Diagnosis not present

## 2023-01-17 NOTE — Telephone Encounter (Signed)
   Pre-operative Risk Assessment    Patient Name: Joy Leonard [facility]  DOB: [birth year] MRN: [id number]      Request for Surgical Clearance    Procedure:   COLONOSCOPY  Date of Surgery:  Clearance 01/27/23                                 Surgeon:  DR. MANN Surgeon's Group or Practice Name:  [facility] Phone number:  [phone] Fax number:  [phone]   Type of Clearance Requested:   - Medical ; NO MEDICATIONS LISTED AS NEEDING TO BE HELD   Type of Anesthesia:   PROPOFOL   Additional requests/questions:    Joy Leonard   01/17/2023, 5:26 PM

## 2023-01-18 ENCOUNTER — Telehealth: Payer: Self-pay

## 2023-01-18 NOTE — Telephone Encounter (Signed)
   Name: Joy Leonard [facility]  DOB: 02-20-1966  MRN: [id number]  Primary Cardiologist: None   Preoperative team, please contact this patient and set up a phone call appointment for further preoperative risk assessment. Please obtain consent and complete medication review. Thank you for your help.    Mayra Reel, NP 01/18/2023, 8:29 AM [place]

## 2023-01-18 NOTE — Telephone Encounter (Signed)
1st attempt to reach pt to schedule tele visit. Lvm  

## 2023-01-18 NOTE — Telephone Encounter (Signed)
Patient is scheduled for tele visit on 01/22/23. Med rec and consent done

## 2023-01-18 NOTE — Telephone Encounter (Signed)
  Patient Consent for Virtual Visit         Joy Leonard has provided verbal consent on 01/18/2023 for a virtual visit (video or telephone).   CONSENT FOR VIRTUAL VISIT FOR:  Joy Leonard  By participating in this virtual visit I agree to the following:  I hereby voluntarily request, consent and authorize [facility] and its employed or contracted physicians, physician assistants, nurse practitioners or other licensed health care professionals (the Practitioner), to provide me with telemedicine health care services (the "Services") as deemed necessary by the treating Practitioner. I acknowledge and consent to receive the Services by the Practitioner via telemedicine. I understand that the telemedicine visit will involve communicating with the Practitioner through live audiovisual communication technology and the disclosure of certain medical information by electronic transmission. I acknowledge that I have been given the opportunity to request an in-person assessment or other available alternative prior to the telemedicine visit and am voluntarily participating in the telemedicine visit.  I understand that I have the right to withhold or withdraw my consent to the use of telemedicine in the course of my care at any time, without affecting my right to future care or treatment, and that the Practitioner or I may terminate the telemedicine visit at any time. I understand that I have the right to inspect all information obtained and/or recorded in the course of the telemedicine visit and may receive copies of available information for a reasonable fee.  I understand that some of the potential risks of receiving the Services via telemedicine include:  Delay or interruption in medical evaluation due to technological equipment failure or disruption; Information transmitted may not be sufficient (e.g. poor resolution of images) to allow for appropriate medical  decision making by the Practitioner; and/or  In rare instances, security protocols could fail, causing a breach of personal health information.  Furthermore, I acknowledge that it is my responsibility to provide information about my medical history, conditions and care that is complete and accurate to the best of my ability. I acknowledge that Practitioner's advice, recommendations, and/or decision may be based on factors not within their control, such as incomplete or inaccurate data provided by me or distortions of diagnostic images or specimens that may result from electronic transmissions. I understand that the practice of medicine is not an exact science and that Practitioner makes no warranties or guarantees regarding treatment outcomes. I acknowledge that a copy of this consent can be made available to me via my patient portal ([place]), or I can request a printed copy by calling the office of [facility].    I understand that my insurance will be billed for this visit.   I have read or had this consent read to me. I understand the contents of this consent, which adequately explains the benefits and risks of the Services being provided via telemedicine.  I have been provided ample opportunity to ask questions regarding this consent and the Services and have had my questions answered to my satisfaction. I give my informed consent for the services to be provided through the use of telemedicine in my medical care

## 2023-01-19 DIAGNOSIS — D509 Iron deficiency anemia, unspecified: Secondary | ICD-10-CM | POA: Diagnosis not present

## 2023-01-23 NOTE — Progress Notes (Unsigned)
Virtual Visit via Telephone Note   Because of [place] co-morbid illnesses, she is at least at moderate risk for complications without adequate follow up.  This format is felt to be most appropriate for this patient at this time.  The patient did not have access to video technology/had technical difficulties with video requiring transitioning to audio format only (telephone).  All issues noted in this document were discussed and addressed.  No physical exam could be performed with this format.  Please refer to the patient's chart for her consent to telehealth for [facility].  Evaluation Performed:  Preoperative cardiovascular risk assessment _____________   Date:  01/23/2023   Patient ID:  Leonard, Joy 1966-07-17, MRN [id number] Patient Location:  Home Provider location:   Office  Primary Care Provider:  Wells Guiles, DO Primary Cardiologist:  None  Chief Complaint / Patient Profile   57 y.o. y/o female with a h/o HTN, high grade heart block s/p PPM implant, diabetes who is pending colonoscopy and presents today for telephonic preoperative cardiovascular risk assessment.  History of Present Illness    Joy Leonard is a 57 y.o. female who presents via audio/video conferencing for a telehealth visit today.  Pt was last seen in cardiology clinic on 08/18/22 by Dr. Lovena Le.  At that time [facility] was doing well.  The patient is now pending procedure as outlined above. Since her last visit, she denies chest pain, shortness of breath, lower extremity edema, fatigue, palpitations, melena, hematuria, hemoptysis, diaphoresis, weakness, presyncope, syncope, orthopnea, and PND. 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   Past Medical History    Past Medical History:  Diagnosis Date   Borderline diabetes    Hypertension    Past Surgical History:  Procedure Laterality Date    CESAREAN SECTION  1990   PACEMAKER IMPLANT N/A 03/04/2019   Procedure: PACEMAKER IMPLANT;  Surgeon: Evans Lance, MD;  Location: [facility];  Service: Cardiovascular;  Laterality: N/A;    Allergies  Allergies  Allergen Reactions   Lisinopril Swelling    Home Medications    Prior to Admission medications   Medication Sig Start Date End Date Taking? Authorizing Provider  amLODipine (NORVASC) 10 MG tablet TAKE 1 TABLET BY MOUTH EVERY DAY 02/25/22   Wells Guiles, DO  Multiple Vitamin (MULTIVITAMIN WITH MINERALS) TABS tablet Take 1 tablet by mouth daily.    [provider]    Physical Exam    Vital Signs:  [facility] does not have vital signs available for review today.  Given telephonic nature of communication, physical exam is limited. AAOx3. NAD. Normal affect.  Speech and respirations are unlabored.  Accessory Clinical Findings    None  Assessment & Plan    1.  Preoperative Cardiovascular Risk Assessment: According to the Revised Cardiac Risk Index (RCRI), her Perioperative Risk of Major Cardiac Event is (%): 0.4. Her Functional Capacity in METs is: 7.59 according to the Duke Activity Status Index (DASI). The patient is doing well from a cardiac perspective. Therefore, based on ACC/AHA guidelines, the patient would be at acceptable risk for the planned procedure without further cardiovascular testing.   The patient was advised that if she develops new symptoms prior to surgery to contact our office to arrange for a follow-up visit, and she verbalized understanding.  No cardiac medications requested to be held.  A copy of this note  will be routed to requesting surgeon.  Time:   Today, I have spent 7 minutes with the patient with telehealth technology discussing medical history, symptoms, and management plan.    Emmaline Life, NP-C  01/25/2023, 9:20 AM [address] Office [phone] Fax [phone]

## 2023-01-25 ENCOUNTER — Encounter: Payer: Self-pay | Admitting: Nurse Practitioner

## 2023-01-25 ENCOUNTER — Ambulatory Visit: Payer: Federal, State, Local not specified - PPO | Attending: Internal Medicine | Admitting: Nurse Practitioner

## 2023-01-25 DIAGNOSIS — Z01810 Encounter for preprocedural cardiovascular examination: Secondary | ICD-10-CM

## 2023-01-27 DIAGNOSIS — K635 Polyp of colon: Secondary | ICD-10-CM | POA: Diagnosis not present

## 2023-01-27 DIAGNOSIS — D123 Benign neoplasm of transverse colon: Secondary | ICD-10-CM | POA: Diagnosis not present

## 2023-01-27 DIAGNOSIS — D122 Benign neoplasm of ascending colon: Secondary | ICD-10-CM | POA: Diagnosis not present

## 2023-01-27 DIAGNOSIS — D125 Benign neoplasm of sigmoid colon: Secondary | ICD-10-CM | POA: Diagnosis not present

## 2023-01-27 DIAGNOSIS — Z1211 Encounter for screening for malignant neoplasm of colon: Secondary | ICD-10-CM | POA: Diagnosis not present

## 2023-01-27 NOTE — Progress Notes (Signed)
Remote pacemaker transmission.   

## 2023-02-02 ENCOUNTER — Telehealth: Payer: Self-pay | Admitting: Internal Medicine

## 2023-02-02 NOTE — Telephone Encounter (Signed)
Remote transmission reviewed. Normal device function. No events noted during time of not feeling well. Patient is in normal rhythm. Routing back to gen cards since patient was recently cleared by gen cards.

## 2023-02-02 NOTE — Telephone Encounter (Signed)
Pt c/o Syncope: STAT if syncope occurred within 30 minutes and pt complains of lightheadedness High Priority if episode of passing out, completely, today or in last 24 hours   Did you pass out today?   No   When is the last time you passed out?   Unknown   Has this occurred multiple times?   Started on Monday, and Tuesday  Did you have any symptoms prior to passing out?   Patient stated she was feeling faint and extremely tired.  Patient stated she sent in a manual transmission today.  Patient noted she had a colonoscopy on Friday.

## 2023-02-02 NOTE — Telephone Encounter (Signed)
Pt called back and made aware of [name], Device RN's assessment of her [employer] transmission.  Per [name], no events, or abnormalities were noted on the device.  Normal device function was noted, and Pt was in normal sinus rhythm at time of assessment.    Pt appreciated the call / report, and stated she has not had anymore symptoms.  Pt offered a [facility] appointment to discuss symptoms at beginning of week.  Pt declined an appointment, but stated she will reach out to [facility] if symptoms return, and will schedule an appointment then if needed.

## 2023-02-06 ENCOUNTER — Other Ambulatory Visit: Payer: Self-pay

## 2023-02-06 ENCOUNTER — Emergency Department (HOSPITAL_COMMUNITY): Payer: Federal, State, Local not specified - PPO

## 2023-02-06 ENCOUNTER — Observation Stay (HOSPITAL_COMMUNITY)
Admission: EM | Admit: 2023-02-06 | Discharge: 2023-02-07 | Disposition: A | Discharge status: Home / Self Care | Payer: Federal, State, Local not specified - PPO | Attending: Family Medicine | Admitting: Family Medicine

## 2023-02-06 ENCOUNTER — Encounter (HOSPITAL_COMMUNITY): Payer: Self-pay | Admitting: Emergency Medicine

## 2023-02-06 ENCOUNTER — Ambulatory Visit (HOSPITAL_COMMUNITY)
Admission: EM | Admit: 2023-02-06 | Discharge: 2023-02-06 | Disposition: A | Discharge status: Home / Self Care | Payer: Federal, State, Local not specified - PPO | Attending: Internal Medicine | Admitting: Internal Medicine

## 2023-02-06 DIAGNOSIS — R1030 Lower abdominal pain, unspecified: Secondary | ICD-10-CM | POA: Diagnosis not present

## 2023-02-06 DIAGNOSIS — D62 Acute posthemorrhagic anemia: Secondary | ICD-10-CM | POA: Diagnosis not present

## 2023-02-06 DIAGNOSIS — Z79899 Other long term (current) drug therapy: Secondary | ICD-10-CM | POA: Insufficient documentation

## 2023-02-06 DIAGNOSIS — E119 Type 2 diabetes mellitus without complications: Secondary | ICD-10-CM | POA: Insufficient documentation

## 2023-02-06 DIAGNOSIS — K661 Hemoperitoneum: Principal | ICD-10-CM | POA: Diagnosis present

## 2023-02-06 DIAGNOSIS — D500 Iron deficiency anemia secondary to blood loss (chronic): Secondary | ICD-10-CM | POA: Diagnosis present

## 2023-02-06 DIAGNOSIS — I442 Atrioventricular block, complete: Secondary | ICD-10-CM | POA: Diagnosis present

## 2023-02-06 DIAGNOSIS — D563 Thalassemia minor: Secondary | ICD-10-CM | POA: Diagnosis not present

## 2023-02-06 DIAGNOSIS — Z9889 Other specified postprocedural states: Secondary | ICD-10-CM | POA: Diagnosis not present

## 2023-02-06 DIAGNOSIS — K668 Other specified disorders of peritoneum: Secondary | ICD-10-CM | POA: Diagnosis not present

## 2023-02-06 DIAGNOSIS — R1084 Generalized abdominal pain: Secondary | ICD-10-CM

## 2023-02-06 DIAGNOSIS — R109 Unspecified abdominal pain: Secondary | ICD-10-CM | POA: Diagnosis not present

## 2023-02-06 DIAGNOSIS — R1032 Left lower quadrant pain: Secondary | ICD-10-CM | POA: Diagnosis not present

## 2023-02-06 DIAGNOSIS — I10 Essential (primary) hypertension: Secondary | ICD-10-CM | POA: Diagnosis present

## 2023-02-06 DIAGNOSIS — Z950 Presence of cardiac pacemaker: Secondary | ICD-10-CM | POA: Diagnosis not present

## 2023-02-06 LAB — COMPREHENSIVE METABOLIC PANEL
ALT: 18 U/L (ref 0–44)
AST: 28 U/L (ref 15–41)
Albumin: 3.8 g/dL (ref 3.5–5.0)
Alkaline Phosphatase: 78 U/L (ref 38–126)
Anion gap: 12 (ref 5–15)
BUN: 13 mg/dL (ref 6–20)
CO2: 24 mmol/L (ref 22–32)
Calcium: 9.1 mg/dL (ref 8.9–10.3)
Chloride: 102 mmol/L (ref 98–111)
Creatinine, Ser: 0.84 mg/dL (ref 0.44–1.00)
GFR, Estimated: >60 mL/min (ref >60)
Glucose, Bld: 103 mg/dL — ABNORMAL HIGH (ref 70–99)
Potassium: 4 mmol/L (ref 3.5–5.1)
Sodium: 138 mmol/L (ref 135–145)
Total Bilirubin: 0.2 mg/dL — ABNORMAL LOW (ref 0.3–1.2)
Total Protein: 7.1 g/dL (ref 6.5–8.1)

## 2023-02-06 LAB — CBC
HCT: 30.9 % — ABNORMAL LOW (ref 36.0–46.0)
Hemoglobin: 9.9 g/dL — ABNORMAL LOW (ref 12.0–15.0)
MCH: 21.6 pg — ABNORMAL LOW (ref 26.0–34.0)
MCHC: 32 g/dL (ref 30.0–36.0)
MCV: 67.5 fL — ABNORMAL LOW (ref 80.0–100.0)
Platelets: 222 10*3/uL (ref 150–400)
RBC: 4.58 MIL/uL (ref 3.87–5.11)
RDW: 17.7 % — ABNORMAL HIGH (ref 11.5–15.5)
WBC: 7.6 10*3/uL (ref 4.0–10.5)
nRBC: 0 % (ref 0.0–0.2)

## 2023-02-06 LAB — URINALYSIS, ROUTINE W REFLEX MICROSCOPIC
Bacteria, UA: NONE SEEN
Bilirubin Urine: NEGATIVE
Glucose, UA: NEGATIVE mg/dL
Ketones, ur: NEGATIVE mg/dL
Leukocytes,Ua: NEGATIVE
Nitrite: NEGATIVE
Protein, ur: NEGATIVE mg/dL
Specific Gravity, Urine: 1.012 (ref 1.005–1.030)
pH: 6 (ref 5.0–8.0)

## 2023-02-06 LAB — LIPASE, BLOOD: Lipase: 26 U/L (ref 11–51)

## 2023-02-06 MED ORDER — FENTANYL CITRATE PF 50 MCG/ML IJ SOSY: 50.0000 ug | PREFILLED_SYRINGE | INTRAMUSCULAR | Status: DC | PRN

## 2023-02-06 MED ORDER — IOHEXOL 350 MG/ML SOLN
75.0000 mL | Freq: Once | INTRAVENOUS | Status: AC | PRN
  Administered 2023-02-06: 75 mL via INTRAVENOUS

## 2023-02-06 NOTE — Discharge Instructions (Addendum)
Dear Joy Leonard [facility],  Thank you for letting us participate in your care. You were hospitalized for abdominal pain and diagnosed with Hemoperitoneum. You were treated with pain medication as needed.  At this time I believe that you need surgical intervention.  It will be very important for you to follow-up with your PCP to ensure resolution of your symptoms.  POST-HOSPITAL & CARE INSTRUCTIONS Please go to medicine Center on 4/10 to have your hemoglobin rechecked Please go to appointment with your PCP on 02/13/2023 at the family medicine center Continue to take Tylenol as needed, additionally you have been prescribed oxycodone for severe pain Go to your follow up appointments (listed below)   DOCTOR'S APPOINTMENT   Future Appointments  Date Time Provider Department Center  02/08/2023 10:00 AM [facility]  02/13/2023  9:50 AM Shelby Mattocks, DO [facility]  03/30/2023  7:00 AM [facility] DEVICE REMOTES [facility]  06/29/2023  7:00 AM [facility] DEVICE REMOTES [facility]  09/29/2023  7:00 AM [facility] DEVICE REMOTES [facility]     Take care and be well!  Family Medicine Teaching Service Inpatient Team [facility]  [address] [place], [place] [address] [phone]

## 2023-02-06 NOTE — ED Notes (Signed)
Patient is being discharged from the Urgent Care and sent to the Emergency Department via POV. Per Dr Leonides Grills, patient is in need of higher level of care due to abdominal pain post-colonoscopy. Patient is aware and verbalizes understanding of plan of care.  Vitals:   02/06/23 1927  BP: 137/85  Pulse: 81  Resp: 17  Temp: 98.7 F (37.1 C)  SpO2: 98%

## 2023-02-06 NOTE — ED Triage Notes (Signed)
Pt with continued abdominal pain, rectal pain, and vaginal pain since her colonoscopy on 3/29.  Pt is tearful at time of triage d/t pain. States pain is constant but worse with urination or bowel movement.

## 2023-02-06 NOTE — Consult Note (Signed)
Reason for Consult/Chief Complaint: hemoperitoneum Consultant: Jodi MourningZavitz, MD  [facility] Joy Leonard is an 57 y.o. female.   HPI: [id number] s/p colonoscopy on 3/29 with abdominal pain that began the day of the procedure and was not improving, causing her to present to the ED. Reports 4 polyps removed and a recommendation for repeat scope in 3y. Endorses nausea the day of the procedure and the following Monday, but no vomiting. Denies fevers.    Past Medical History:  Diagnosis Date   Borderline diabetes    Hypertension     Past Surgical History:  Procedure Laterality Date   CESAREAN SECTION  1990   PACEMAKER IMPLANT N/A 03/04/2019   Procedure: PACEMAKER IMPLANT;  Surgeon: Marinus Mawaylor, Gregg W, MD;  Location: [facility] INVASIVE CV LAB;  Service: Cardiovascular;  Laterality: N/A;    Family History  Problem Relation Age of Onset   Hypertension Mother    Diabetes Mellitus II Mother    Dementia Father    Heart disease Other        had pacemaker   Breast cancer Neg Hx     Social History:  reports that she has never smoked. She has never used smokeless tobacco. She reports that she does not drink alcohol and does not use drugs.  Allergies:  Allergies  Allergen Reactions   Lisinopril Swelling    Medications: I have reviewed the patient's current medications.  Results for orders placed or performed during the hospital encounter of 02/06/23 (from the past 48 hour(s))  Lipase, blood     Status: None   Collection Time: 02/06/23  8:21 PM  Result Value Ref Range   Lipase 26 11 - 51 U/L    Comment: Performed at [facility], [address] [place], [place] [address]  Comprehensive metabolic panel     Status: Abnormal   Collection Time: 02/06/23  8:21 PM  Result Value Ref Range   Sodium 138 135 - 145 mmol/L   Potassium 4.0 3.5 - 5.1 mmol/L   Chloride 102 98 - 111 mmol/L   CO2 24 22 - 32 mmol/L   Glucose, Bld 103 (H) 70 - 99 mg/dL    Comment: Glucose reference range applies only to  samples taken after fasting for at least 8 hours.   BUN 13 6 - 20 mg/dL   Creatinine, Ser 6.040.84 0.44 - 1.00 mg/dL   Calcium 9.1 8.9 - 54.010.3 mg/dL   Total Protein 7.1 6.5 - 8.1 g/dL   Albumin 3.8 3.5 - 5.0 g/dL   AST 28 15 - 41 U/L   ALT 18 0 - 44 U/L   Alkaline Phosphatase 78 38 - 126 U/L   Total Bilirubin 0.2 (L) 0.3 - 1.2 mg/dL   GFR, Estimated >98>60 >11>60 mL/min    Comment: (NOTE) Calculated using the CKD-EPI Creatinine Equation (2021)    Anion gap 12 5 - 15    Comment: Performed at [facility], [address] [place], [place] [address]  CBC     Status: Abnormal   Collection Time: 02/06/23  8:21 PM  Result Value Ref Range   WBC 7.6 4.0 - 10.5 K/uL   RBC 4.58 3.87 - 5.11 MIL/uL   Hemoglobin 9.9 (L) 12.0 - 15.0 g/dL   HCT 29.530.9 (L) 62.136.0 - 30.846.0 %   MCV 67.5 (L) 80.0 - 100.0 fL   MCH 21.6 (L) 26.0 - 34.0 pg   MCHC 32.0 30.0 - 36.0 g/dL   RDW 65.717.7 (H)  11.5 - 15.5 %   Platelets 222 150 - 400 K/uL    Comment: REPEATED TO VERIFY   nRBC 0.0 0.0 - 0.2 %    Comment: Performed at [facility], [address], [place], [place] [address]  Urinalysis, Routine w reflex microscopic -Urine, Clean Catch     Status: Abnormal   Collection Time: 02/06/23  8:22 PM  Result Value Ref Range   Color, Urine YELLOW YELLOW   APPearance HAZY (A) CLEAR   Specific Gravity, Urine 1.012 1.005 - 1.030   pH 6.0 5.0 - 8.0   Glucose, UA NEGATIVE NEGATIVE mg/dL   Hgb urine dipstick SMALL (A) NEGATIVE   Bilirubin Urine NEGATIVE NEGATIVE   Ketones, ur NEGATIVE NEGATIVE mg/dL   Protein, ur NEGATIVE NEGATIVE mg/dL   Nitrite NEGATIVE NEGATIVE   Leukocytes,Ua NEGATIVE NEGATIVE   RBC / HPF 0-5 0 - 5 RBC/hpf   WBC, UA 0-5 0 - 5 WBC/hpf   Bacteria, UA NONE SEEN NONE SEEN   Squamous Epithelial / HPF 0-5 0 - 5 /HPF    Comment: Performed at [facility], [address], [place], [place] [address]    CT ABDOMEN PELVIS W CONTRAST  Result Date: 02/06/2023 CLINICAL DATA:  Abdominal pain post  colonoscopy. EXAM: CT ABDOMEN AND PELVIS WITH CONTRAST TECHNIQUE: Multidetector CT imaging of the abdomen and pelvis was performed using the standard protocol following bolus administration of intravenous contrast. RADIATION DOSE REDUCTION: This exam was performed according to the departmental dose-optimization program which includes automated exposure control, adjustment of the mA and/or kV according to patient size and/or use of iterative reconstruction technique. CONTRAST:  8mL OMNIPAQUE IOHEXOL 350 MG/ML SOLN COMPARISON:  None Available. FINDINGS: Lower chest: No acute abnormality. Hepatobiliary: There are scattered hypodensities in the left lobe of the liver measuring up to 11 mm favored as small cysts or hemangiomas. Otherwise, the liver, gallbladder and bile ducts are within normal limits. Pancreas: Unremarkable. No pancreatic ductal dilatation or surrounding inflammatory changes. Spleen: Normal in size without focal abnormality. Adrenals/Urinary Tract: Adrenal glands are unremarkable. Kidneys are normal, without renal calculi, focal lesion, or hydronephrosis. Bladder is unremarkable. Stomach/Bowel: Stomach is within normal limits. Appendix appears normal. No evidence of bowel wall thickening, distention, or inflammatory changes. Vascular/Lymphatic: No significant vascular findings are present. No enlarged abdominal or pelvic lymph nodes. Reproductive: Uterus and ovaries are not well delineated on this study. Other: There is hyperdense free fluid in the cul-de-sac, moderate amount. There is additional moderate amount of mildly hyperdense free fluid throughout the pelvis surrounding the bladder dome. No free air. Small fat containing umbilical hernia. Musculoskeletal: No fracture is seen. IMPRESSION: 1. Moderate amount of mildly hyperdense free fluid in the pelvis concerning for hemoperitoneum. Uterus and ovaries are not well delineated on this study. Findings may be related to ruptured hemorrhagic ovarian  cyst. Other etiologies not excluded. 2. No free air. 3. Small hypodensities in the liver favored as small cysts or hemangiomas. Electronically Signed   By: Darliss Cheney M.D.   On: 02/06/2023 23:10    ROS 10 point review of systems is negative except as listed above in HPI.   Physical Exam Blood pressure 135/82, pulse 71, temperature 98.8 F (37.1 C), temperature source Oral, resp. rate 14, last menstrual period 09/23/2019, SpO2 100 %. Constitutional: well-developed, well-nourished HEENT: pupils equal, round, reactive to light, 64mm b/l, moist conjunctiva, external inspection of ears and nose normal, hearing intact Oropharynx: normal oropharyngeal mucosa, normal dentition Neck: no thyromegaly,  trachea midline, no midline cervical tenderness to palpation Chest: breath sounds equal bilaterally, normal respiratory effort, no midline or lateral chest wall tenderness to palpation/deformity Abdomen: soft, NT, no bruising, no hepatosplenomegaly GU: normal female genitalia  Back: no wounds, no thoracic/lumbar spine tenderness to palpation, no thoracic/lumbar spine stepoffs Rectal: deferred Extremities: 2+ radial and pedal pulses bilaterally, intact motor and sensation bilateral UE and LE, no peripheral edema MSK: unable to assess gait/station, no clubbing/cyanosis of fingers/toes, normal ROM of all four extremities Skin: warm, dry, no rashes Psych: normal memory, normal mood/affect     Assessment/Plan: 69F with hemoperitoneum after colonoscopy. I suspect she had a mild splenic or splenic hilum injury from scope trauma. Hgb is stable on repeat check and she is hungry. Recommend repeat CBC this PM and PO challenge. If hgb stable and tolerating PO, I believe she is appropriate for discharge later today.    Diamantina Monks, MD General and Trauma Surgery [facility]

## 2023-02-06 NOTE — ED Provider Notes (Addendum)
[place] EMERGENCY DEPARTMENT AT [facility] Provider Note   CSN: [id number] Arrival date & time: 02/06/23  1955     History  Chief Complaint  Patient presents with   Abdominal Pain    Joy Leonard is a 57 y.o. female.  Patient presents with persistent pain lower and left-sided since colonoscopy performed by Dr. Loreta Ave on March 29.  Patient tearful due to pain.  Patient also has dysuria and pain worse with bowel movements.  No blood in stool.  Patient had 4 polyps removed per her report.       Home Medications Prior to Admission medications   Medication Sig Start Date End Date Taking? Authorizing Provider  amLODipine (NORVASC) 10 MG tablet TAKE 1 TABLET BY MOUTH EVERY DAY 02/25/22   Shelby Mattocks, DO  Multiple Vitamin (MULTIVITAMIN WITH MINERALS) TABS tablet Take 1 tablet by mouth daily.    [provider]      Allergies    Lisinopril    Review of Systems   Review of Systems  Constitutional:  Negative for chills and fever.  HENT:  Negative for congestion.   Eyes:  Negative for visual disturbance.  Respiratory:  Negative for shortness of breath.   Cardiovascular:  Negative for chest pain.  Gastrointestinal:  Positive for abdominal pain. Negative for vomiting.  Genitourinary:  Positive for dysuria. Negative for flank pain.  Musculoskeletal:  Negative for back pain, neck pain and neck stiffness.  Skin:  Negative for rash.  Neurological:  Negative for light-headedness and headaches.    Physical Exam Updated Vital Signs BP (!) 142/87   Pulse 73   Temp 98.8 F (37.1 C) (Oral)   Resp 13   LMP 09/23/2019   SpO2 100%  Physical Exam Vitals and nursing note reviewed.  Constitutional:      General: Joy Leonard is not in acute distress.    Appearance: Joy Leonard is well-developed.  HENT:     Head: Normocephalic and atraumatic.     Mouth/Throat:     Mouth: Mucous membranes are moist.  Eyes:     General:        Right eye: No discharge.         Left eye: No discharge.     Conjunctiva/sclera: Conjunctivae normal.  Neck:     Trachea: No tracheal deviation.  Cardiovascular:     Rate and Rhythm: Normal rate and regular rhythm.     Heart sounds: No murmur heard. Pulmonary:     Effort: Pulmonary effort is normal.     Breath sounds: Normal breath sounds.  Abdominal:     General: There is no distension.     Palpations: Abdomen is soft.     Tenderness: There is abdominal tenderness in the periumbilical area, suprapubic area and left lower quadrant. There is no guarding.  Musculoskeletal:     Cervical back: Normal range of motion and neck supple. No rigidity.  Skin:    General: Skin is warm.     Capillary Refill: Capillary refill takes less than 2 seconds.     Findings: No rash.  Neurological:     General: No focal deficit present.     Mental Status: Joy Leonard is alert.     Cranial Nerves: No cranial nerve deficit.  Psychiatric:        Mood and Affect: Mood normal.     ED Results / Procedures / Treatments   Labs (all labs ordered are listed, but only abnormal results are displayed) Labs Reviewed  COMPREHENSIVE METABOLIC PANEL - Abnormal; Notable for the following components:      Result Value   Glucose, Bld 103 (*)    Total Bilirubin 0.2 (*)    All other components within normal limits  CBC - Abnormal; Notable for the following components:   Hemoglobin 9.9 (*)    HCT 30.9 (*)    MCV 67.5 (*)    MCH 21.6 (*)    RDW 17.7 (*)    All other components within normal limits  URINALYSIS, ROUTINE W REFLEX MICROSCOPIC - Abnormal; Notable for the following components:   APPearance HAZY (*)    Hgb urine dipstick SMALL (*)    All other components within normal limits  LIPASE, BLOOD  TYPE AND SCREEN    EKG None  Radiology CT ABDOMEN PELVIS W CONTRAST  Result Date: 02/06/2023 CLINICAL DATA:  Abdominal pain post colonoscopy. EXAM: CT ABDOMEN AND PELVIS WITH CONTRAST TECHNIQUE: Multidetector CT imaging of the abdomen and pelvis was  performed using the standard protocol following bolus administration of intravenous contrast. RADIATION DOSE REDUCTION: This exam was performed according to the departmental dose-optimization program which includes automated exposure control, adjustment of the mA and/or kV according to patient size and/or use of iterative reconstruction technique. CONTRAST:  35mL OMNIPAQUE IOHEXOL 350 MG/ML SOLN COMPARISON:  None Available. FINDINGS: Lower chest: No acute abnormality. Hepatobiliary: There are scattered hypodensities in the left lobe of the liver measuring up to 11 mm favored as small cysts or hemangiomas. Otherwise, the liver, gallbladder and bile ducts are within normal limits. Pancreas: Unremarkable. No pancreatic ductal dilatation or surrounding inflammatory changes. Spleen: Normal in size without focal abnormality. Adrenals/Urinary Tract: Adrenal glands are unremarkable. Kidneys are normal, without renal calculi, focal lesion, or hydronephrosis. Bladder is unremarkable. Stomach/Bowel: Stomach is within normal limits. Appendix appears normal. No evidence of bowel wall thickening, distention, or inflammatory changes. Vascular/Lymphatic: No significant vascular findings are present. No enlarged abdominal or pelvic lymph nodes. Reproductive: Uterus and ovaries are not well delineated on this study. Other: There is hyperdense free fluid in the cul-de-sac, moderate amount. There is additional moderate amount of mildly hyperdense free fluid throughout the pelvis surrounding the bladder dome. No free air. Small fat containing umbilical hernia. Musculoskeletal: No fracture is seen. IMPRESSION: 1. Moderate amount of mildly hyperdense free fluid in the pelvis concerning for hemoperitoneum. Uterus and ovaries are not well delineated on this study. Findings may be related to ruptured hemorrhagic ovarian cyst. Other etiologies not excluded. 2. No free air. 3. Small hypodensities in the liver favored as small cysts or  hemangiomas. Electronically Signed   By: Darliss Cheney M.D.   On: 02/06/2023 23:10    Procedures .Critical Care  Performed by: Blane Ohara, MD Authorized by: Blane Ohara, MD   Critical care provider statement:    Critical care time (minutes):  30   Critical care start time:  02/06/2023 10:50 PM   Critical care end time:  02/06/2023 11:20 PM   Critical care time was exclusive of:  Separately billable procedures and treating other patients and teaching time   Critical care was time spent personally by me on the following activities:  Development of treatment plan with patient or surrogate, discussions with consultants, evaluation of patient's response to treatment, examination of patient, ordering and review of laboratory studies, ordering and review of radiographic studies, ordering and performing treatments and interventions, pulse oximetry, re-evaluation of patient's condition and review of old charts     Medications Ordered in ED Medications  fentaNYL (SUBLIMAZE) injection 50 mcg (has no administration in time range)  iohexol (OMNIPAQUE) 350 MG/ML injection 75 mL (75 mLs Intravenous Contrast Given 02/06/23 2302)    ED Course/ Medical Decision Making/ A&P                             Medical Decision Making Amount and/or Complexity of Data Reviewed Labs: ordered. Radiology: ordered.  Risk Prescription drug management. Decision regarding hospitalization.   Patient presents with significant pain, tearful in the room.  Patient does not want pain meds at this time.  Differential includes inflammatory such as colitis, occult perforation, urine infection, other.  Plan for blood work which was independently reviewed hemoglobin dropped from 11.5-9.9 from 1 month prior.  Small amount of blood in the urine but no signs infection, white count unremarkable, electrolytes within normal limits.  CT abdomen pelvis with contrast for further delineation of pain.  CT scan results reviewed showing  hemoperitoneum, no free air.  Discussed with general surgery Dr Bedelia PersonLovick at 11:25 pm who will be on consult recommends observation by primary service, will repeat hemoglobin in the morning.  Likely injury to spleen.  Urinalysis reviewed independently no signs of infection.  Paged family medicine resident service for admission.        Final Clinical Impression(s) / ED Diagnoses Final diagnoses:  Lower abdominal pain  Hemoperitoneum  Blood loss anemia    Rx / DC Orders ED Discharge Orders     None         Blane OharaZavitz, Ryanne Morand, MD 02/06/23 [id number]    Blane OharaZavitz, Venson Ferencz, MD 02/06/23 [id number]    Blane OharaZavitz, Tavish Gettis, MD 02/06/23 2328

## 2023-02-06 NOTE — Discharge Instructions (Signed)
Given that you have had abdominal pain 1 week after colonoscopy, I will recommend you go to the emergency room for imaging.  I want to make sure that you do not have any complications from the procedure.

## 2023-02-06 NOTE — ED Notes (Signed)
Patient transported to CT 

## 2023-02-06 NOTE — H&P (Incomplete)
[facility] Admission History and Physical Service Pager: [phone]  Patient name: Joy Leonard Medical record number: [id number] Date of Birth: [birth year] Age: 57 y.o. Gender: female  Primary Care Provider: Shelby Mattocks, DO Consultants: General Surgery  Code Status: FULL which was confirmed with family if patient unable to confirm  Preferred Emergency Contact: Lajean Saver (sister) [phone]; and son Lorelei Pont [phone]  Chief Complaint: Abdominal Pain   Assessment and Plan: Joy Leonard is a 57 y.o. female presenting with abdominal pain . Differential for this patient's presentation of this includes splenic injury, ruptured hemorrhagic ovarian cyst, colonic bleed s/p polyp removal, colonic perforation. Colonic perforation unlikely as there was no free air on the CT and patient would more likely have vital sign instability with fever. Ruptured hemorrhagic ovarian cyst less likely as patient has not been ovulating in many years. Most likely splenic injury.   * Hemoperitoneum Found to have hemoperitoneum on CT scan. Possibly due to injury during colonoscopy- maybe spleen?. CT could not rule out ruptured ovarian cyst; however, this is less likely as the pain started right after the colonoscopy. .  - Admit to FPTS, Attending Dr. McDiarmid  - General surgery consulted by ED physician- recc Hgb in AM and observation overnight - AM CBC  - Iron, TIBC, ferritin  - Serial abdominal exams  - Scheduled tylenol 650 q 6 hours, Oxycodone 5 mg q4 hours for moderate-severe pain  - PT treat and eval   Anemia Most likely exacerbated by subacute bleed. However, patient also has history of anemia. There is microcytosis with MCV 67.5. Most likely iron deficiency anemia. Patient has history of irregular menorrhagia.  - Iron, TIBC, Ferritin - AM CBC   Hypertension Continue home Amlodipine    FEN/GI: NPO  VTE Prophylaxis: SCDs  Disposition: home   History of  Present Illness:  Joy Leonard is a 57 y.o. female presenting with abdominal pain.  Patient has had pain since her colonoscopy on March 29th. She threw up on the day of her colonoscopy. Patient had dysuria and pain with bowel movements.since the colonoscopy. She had 4 polyps removed during colonoscopy.   She says the pain has been on and off. She got concerned over the weekend and noticed pain with urination. She was feeling sharp pains in the middle of her abdomen. No pain in her back. Denies any blood in her stool when she wipes on the toilet paper, but does not look in the toilet bowl at her stool.  She went to the urgent care today and got sent to the ED for a CT scan. 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   She has been in menopause since age 84. No more periods. Denies any ovarian cysts. Has hx of irregular periods,  She says her anemia in the past was "nothing." Never took any iron supplements. No dizziness, shortness of breath, dyspnea on exertion today at work.  In the ED, patient had stable vitals. Her hemoglobin is 9.9. CT abdomen pelvis showed hemoperitoneum without free air. General surgery was consulted and said that primary team should observe and do a hemoglobin repeat in the morning. UA without evidence of UTI.   Review Of Systems: Per HPI with the following additions:   Pertinent Past Medical History: H/o complete heart block with pacemaker  HTN  T2DM H/o anemia  Remainder reviewed in history tab.   Pertinent Past Surgical History: Colonoscopy with polyp removal  Pacemaker placement Remainder reviewed in history tab.  Pertinent Social History: Tobacco use: Yes/No/Former Alcohol use: None Other Substance use: None Lives alone. Sister across the street  Pertinent Family History: Remainder reviewed in history tab.   Important Outpatient Medications: Amlodipine 10 mg, MVI  Remainder reviewed in medication history.    Objective: BP 135/85   Pulse 74   Temp 98.4 F (36.9 C) (Oral)   Resp 16   LMP 09/23/2019   SpO2 99%  Exam: General: Well appearing, in no acute distress Eyes: Pupils PERRLA. No conjunctival injection. ENTM: Mucous membranes moist, no oropharyngeal erythema Cardiovascular: RRR, pacemaker, radial pulses equal and palpable, cap refill = 2 seconds Respiratory: Normal work of breathing on room air  Gastrointestinal: Soft, tender to palpation mild in LUQ, moderate pain to palpation of periumbilical and middle lower abdomen. No pain in LLQ or RLQ. No guarding. No distension  Neuro: Alert and oriented   Labs:  CBC BMET  Recent Labs  Lab 02/07/23 0038  WBC 6.6  HGB 10.0*  HCT 31.0*  PLT 206   Recent Labs  Lab 02/06/23 2021  NA 138  K 4.0  CL 102  CO2 24  BUN 13  CREATININE 0.84  GLUCOSE 103*  CALCIUM 9.1    Pertinent additional labs UA unremarkable.   Imaging Studies Performed: CT Abdomen Pelvis:  1. Moderate amount of mildly hyperdense free fluid in the pelvis concerning for hemoperitoneum. Uterus and ovaries are not well delineated on this study. Findings may be related to ruptured hemorrhagic ovarian cyst. Other etiologies not excluded. 2. No free air. 3. Small hypodensities in the liver favored as small cysts or hemangiomas.   Darral Dash, DO 02/07/2023, 2:49 AM PGY-1, [facility]  FPTS Intern pager: [phone], text pages welcome Secure chat group [facility] Teaching Service

## 2023-02-06 NOTE — ED Triage Notes (Signed)
Pt reports had colonoscopy 3/29 and having mid abd pains since. Ws told it was gas and take tylenol. Reports tylenol not helping. Pt states that pain is constant.

## 2023-02-06 NOTE — Hospital Course (Addendum)
Joy Leonard is a 57 y.o.female with a history of HTN, complete heart block w/ pacemaker, anemia who was admitted to the [facility] Teaching Service at [facility] for abdominal pain. Her hospital course is detailed below:  Hemoperitoneum  Patient had pain after colonoscopy on March 29th. She had 4 polyps removed at that time. Hgb on admission was 9.9 from baseline of about 11. CT abdomen pelvis significant for hemoperitoneum without any free air. She was hemodynamically stable. General surgery was consulted and recommended overnight observation with another hemoglobin in the morning. Explained pain was most likely due to splenic injury. Pain was treated with ***.    Other chronic conditions were medically managed with home medications and formulary alternatives as necessary (hypertension)  PCP Follow-up Recommendations:

## 2023-02-07 ENCOUNTER — Other Ambulatory Visit (HOSPITAL_COMMUNITY): Payer: Self-pay

## 2023-02-07 ENCOUNTER — Other Ambulatory Visit: Payer: Self-pay | Admitting: Student

## 2023-02-07 ENCOUNTER — Observation Stay (HOSPITAL_COMMUNITY): Payer: Federal, State, Local not specified - PPO

## 2023-02-07 ENCOUNTER — Encounter (HOSPITAL_COMMUNITY): Payer: Self-pay | Admitting: Family Medicine

## 2023-02-07 DIAGNOSIS — K661 Hemoperitoneum: Secondary | ICD-10-CM | POA: Diagnosis not present

## 2023-02-07 DIAGNOSIS — K668 Other specified disorders of peritoneum: Secondary | ICD-10-CM | POA: Diagnosis not present

## 2023-02-07 DIAGNOSIS — D500 Iron deficiency anemia secondary to blood loss (chronic): Secondary | ICD-10-CM | POA: Diagnosis present

## 2023-02-07 DIAGNOSIS — R1030 Lower abdominal pain, unspecified: Secondary | ICD-10-CM | POA: Diagnosis not present

## 2023-02-07 DIAGNOSIS — R102 Pelvic and perineal pain: Secondary | ICD-10-CM | POA: Diagnosis not present

## 2023-02-07 LAB — TYPE AND SCREEN
ABO/RH(D): O POS
Antibody Screen: NEGATIVE

## 2023-02-07 LAB — CBC
HCT: 31 % — ABNORMAL LOW (ref 36.0–46.0)
HCT: 33.1 % — ABNORMAL LOW (ref 36.0–46.0)
Hemoglobin: 10 g/dL — ABNORMAL LOW (ref 12.0–15.0)
Hemoglobin: 10.4 g/dL — ABNORMAL LOW (ref 12.0–15.0)
MCH: 21.4 pg — ABNORMAL LOW (ref 26.0–34.0)
MCH: 21.8 pg — ABNORMAL LOW (ref 26.0–34.0)
MCHC: 31.4 g/dL (ref 30.0–36.0)
MCHC: 32.3 g/dL (ref 30.0–36.0)
MCV: 67.5 fL — ABNORMAL LOW (ref 80.0–100.0)
MCV: 68.1 fL — ABNORMAL LOW (ref 80.0–100.0)
Platelets: 206 10*3/uL (ref 150–400)
Platelets: 217 10*3/uL (ref 150–400)
RBC: 4.59 MIL/uL (ref 3.87–5.11)
RBC: 4.86 MIL/uL (ref 3.87–5.11)
RDW: 17.4 % — ABNORMAL HIGH (ref 11.5–15.5)
RDW: 17.6 % — ABNORMAL HIGH (ref 11.5–15.5)
WBC: 5.6 10*3/uL (ref 4.0–10.5)
WBC: 6.6 10*3/uL (ref 4.0–10.5)
nRBC: 0 % (ref 0.0–0.2)
nRBC: 0 % (ref 0.0–0.2)

## 2023-02-07 LAB — HEMOGLOBIN A1C
Hgb A1c MFr Bld: 6.3 % — ABNORMAL HIGH (ref 4.8–5.6)
Mean Plasma Glucose: 134.11 mg/dL

## 2023-02-07 LAB — FERRITIN: Ferritin: 142 ng/mL (ref 11–307)

## 2023-02-07 LAB — IRON AND TIBC
Iron: 19 ug/dL — ABNORMAL LOW (ref 28–170)
Saturation Ratios: 6 % — ABNORMAL LOW (ref 10.4–31.8)
TIBC: 323 ug/dL (ref 250–450)
UIBC: 304 ug/dL

## 2023-02-07 LAB — HIV ANTIBODY (ROUTINE TESTING W REFLEX): HIV Screen 4th Generation wRfx: NONREACTIVE

## 2023-02-07 LAB — ABO/RH: ABO/RH(D): O POS

## 2023-02-07 MED ORDER — HYDROCODONE-ACETAMINOPHEN 7.5-325 MG PO TABS: 1.0000 | ORAL_TABLET | Freq: Once | ORAL | Status: DC

## 2023-02-07 MED ORDER — AMLODIPINE BESYLATE 10 MG PO TABS
10.0000 mg | ORAL_TABLET | Freq: Every day | ORAL | Status: DC
  Administered 2023-02-07: 10 mg via ORAL
  Filled 2023-02-07: qty 1

## 2023-02-07 MED ORDER — DOCUSATE SODIUM 100 MG PO CAPS
100.0000 mg | ORAL_CAPSULE | Freq: Two times a day (BID) | ORAL | Status: DC
  Administered 2023-02-07: 100 mg via ORAL
  Filled 2023-02-07: qty 1

## 2023-02-07 MED ORDER — POLYETHYLENE GLYCOL 3350 17 G PO PACK: 17.0000 g | PACK | Freq: Every day | ORAL | Status: DC | PRN

## 2023-02-07 MED ORDER — ACETAMINOPHEN 325 MG PO TABS: 650.0000 mg | ORAL_TABLET | Freq: Four times a day (QID) | ORAL | Status: DC

## 2023-02-07 MED ORDER — OXYCODONE HCL 5 MG PO TABS: 5.0000 mg | ORAL_TABLET | ORAL | Status: DC | PRN

## 2023-02-07 MED ORDER — OXYCODONE HCL 5 MG PO TABS
5.0000 mg | ORAL_TABLET | ORAL | 0 refills | Status: DC | PRN
  Filled 2023-02-07: qty 5, 2d supply, fill #0

## 2023-02-07 NOTE — Assessment & Plan Note (Signed)
Hx Alpha Thalassemia noted

## 2023-02-07 NOTE — ED Notes (Signed)
ED TO INPATIENT HANDOFF REPORT  ED Nurse Name and Phone #: Morrie Sheldon RN [phone]  S Name/Age/Gender Joy Leonard 57 y.o. female Room/Bed: 017C/017C  Code Status   Code Status: Prior  Home/SNF/Other Home Patient oriented to: self, place, time, and situation Is this baseline? Yes   Triage Complete: Triage complete  Chief Complaint Hemoperitoneum [K66.1]  Triage Note Pt with continued abdominal pain, rectal pain, and vaginal pain since her colonoscopy on 3/29.  Pt is tearful at time of triage d/t pain. States pain is constant but worse with urination or bowel movement.    Allergies Allergies  Allergen Reactions   Lisinopril Swelling    Level of Care/Admitting Diagnosis ED Disposition     ED Disposition  Admit   Condition  --   Comment  [facility] Area: [facility] [[id number]]  Level of Care: Telemetry Medical [104]  May place patient in observation at [facility] or [facility] if equivalent level of care is available:: No  Covid Evaluation: Asymptomatic - no recent exposure (last 10 days) testing not required  Diagnosis: Hemoperitoneum [846659]  Admitting Physician: Lockie Mola [[id number]]  Attending Physician: Perley Jain, TODD D Friso.Males          B Medical/Surgery History Past Medical History:  Diagnosis Date   Borderline diabetes    Hypertension    Past Surgical History:  Procedure Laterality Date   CESAREAN SECTION  1990   PACEMAKER IMPLANT N/A 03/04/2019   Procedure: PACEMAKER IMPLANT;  Surgeon: Marinus Maw, MD;  Location: [facility];  Service: Cardiovascular;  Laterality: N/A;     A IV Location/Drains/Wounds Patient Lines/Drains/Airways Status     Active Line/Drains/Airways     Name Placement date Placement time Site Days   Peripheral IV 02/06/23 20 G Right Antecubital 02/06/23  2137  Antecubital  1   Incision (Closed) 03/04/19 Chest Left;Upper 03/04/19  1533  -- 1436            Intake/Output Last 24  hours No intake or output data in the 24 hours ending 02/07/23 0016  Labs/Imaging Results for orders placed or performed during the Leonard encounter of 02/06/23 (from the past 48 hour(s))  Lipase, blood     Status: None   Collection Time: 02/06/23  8:21 PM  Result Value Ref Range   Lipase 26 11 - 51 U/L    Comment: Performed at [facility], [address], [place], [place] [address]  Comprehensive metabolic panel     Status: Abnormal   Collection Time: 02/06/23  8:21 PM  Result Value Ref Range   Sodium 138 135 - 145 mmol/L   Potassium 4.0 3.5 - 5.1 mmol/L   Chloride 102 98 - 111 mmol/L   CO2 24 22 - 32 mmol/L   Glucose, Bld 103 (H) 70 - 99 mg/dL    Comment: Glucose reference range applies only to samples taken after fasting for at least 8 hours.   BUN 13 6 - 20 mg/dL   Creatinine, Ser 3.00 0.44 - 1.00 mg/dL   Calcium 9.1 8.9 - 92.3 mg/dL   Total Protein 7.1 6.5 - 8.1 g/dL   Albumin 3.8 3.5 - 5.0 g/dL   AST 28 15 - 41 U/L   ALT 18 0 - 44 U/L   Alkaline Phosphatase 78 38 - 126 U/L   Total Bilirubin 0.2 (L) 0.3 - 1.2 mg/dL   GFR, Estimated >30 >07 mL/min    Comment: (NOTE) Calculated using the  CKD-EPI Creatinine Equation (2021)    Anion gap 12 5 - 15    Comment: Performed at [facility], [address] [place], [place] [address]  CBC     Status: Abnormal   Collection Time: 02/06/23  8:21 PM  Result Value Ref Range   WBC 7.6 4.0 - 10.5 K/uL   RBC 4.58 3.87 - 5.11 MIL/uL   Hemoglobin 9.9 (L) 12.0 - 15.0 g/dL   HCT 25.930.9 (L) 56.336.0 - 87.546.0 %   MCV 67.5 (L) 80.0 - 100.0 fL   MCH 21.6 (L) 26.0 - 34.0 pg   MCHC 32.0 30.0 - 36.0 g/dL   RDW 64.317.7 (H) 32.911.5 - 51.815.5 %   Platelets 222 150 - 400 K/uL    Comment: REPEATED TO VERIFY   nRBC 0.0 0.0 - 0.2 %    Comment: Performed at [facility], [address] [place], [place] [address]  Urinalysis, Routine w reflex microscopic -Urine, Clean Catch     Status: Abnormal   Collection Time: 02/06/23  8:22 PM  Result Value  Ref Range   Color, Urine YELLOW YELLOW   APPearance HAZY (A) CLEAR   Specific Gravity, Urine 1.012 1.005 - 1.030   pH 6.0 5.0 - 8.0   Glucose, UA NEGATIVE NEGATIVE mg/dL   Hgb urine dipstick SMALL (A) NEGATIVE   Bilirubin Urine NEGATIVE NEGATIVE   Ketones, ur NEGATIVE NEGATIVE mg/dL   Protein, ur NEGATIVE NEGATIVE mg/dL   Nitrite NEGATIVE NEGATIVE   Leukocytes,Ua NEGATIVE NEGATIVE   RBC / HPF 0-5 0 - 5 RBC/hpf   WBC, UA 0-5 0 - 5 WBC/hpf   Bacteria, UA NONE SEEN NONE SEEN   Squamous Epithelial / HPF 0-5 0 - 5 /HPF    Comment: Performed at [facility], [address] [place], [place] [address]  Type and screen     Status: None (Preliminary result)   Collection Time: 02/06/23 11:46 PM  Result Value Ref Range   ABO/RH(D) PENDING    Antibody Screen PENDING    Sample Expiration      02/09/2023,2359 Performed at [facility], [address] [place], [place] [address]   ABO/Rh     Status: None (Preliminary result)   Collection Time: 02/06/23 11:52 PM  Result Value Ref Range   ABO/RH(D) PENDING    CT ABDOMEN PELVIS W CONTRAST  Result Date: 02/06/2023 CLINICAL DATA:  Abdominal pain post colonoscopy. EXAM: CT ABDOMEN AND PELVIS WITH CONTRAST TECHNIQUE: Multidetector CT imaging of the abdomen and pelvis was performed using the standard protocol following bolus administration of intravenous contrast. RADIATION DOSE REDUCTION: This exam was performed according to the departmental dose-optimization program which includes automated exposure control, adjustment of the mA and/or kV according to patient size and/or use of iterative reconstruction technique. CONTRAST:  75mL OMNIPAQUE IOHEXOL 350 MG/ML SOLN COMPARISON:  None Available. FINDINGS: Lower chest: No acute abnormality. Hepatobiliary: There are scattered hypodensities in the left lobe of the liver measuring up to 11 mm favored as small cysts or hemangiomas. Otherwise, the liver, gallbladder and bile ducts are within normal  limits. Pancreas: Unremarkable. No pancreatic ductal dilatation or surrounding inflammatory changes. Spleen: Normal in size without focal abnormality. Adrenals/Urinary Tract: Adrenal glands are unremarkable. Kidneys are normal, without renal calculi, focal lesion, or hydronephrosis. Bladder is unremarkable. Stomach/Bowel: Stomach is within normal limits. Appendix appears normal. No evidence of bowel wall thickening, distention, or inflammatory changes. Vascular/Lymphatic: No significant vascular findings are present. No enlarged  abdominal or pelvic lymph nodes. Reproductive: Uterus and ovaries are not well delineated on this study. Other: There is hyperdense free fluid in the cul-de-sac, moderate amount. There is additional moderate amount of mildly hyperdense free fluid throughout the pelvis surrounding the bladder dome. No free air. Small fat containing umbilical hernia. Musculoskeletal: No fracture is seen. IMPRESSION: 1. Moderate amount of mildly hyperdense free fluid in the pelvis concerning for hemoperitoneum. Uterus and ovaries are not well delineated on this study. Findings may be related to ruptured hemorrhagic ovarian cyst. Other etiologies not excluded. 2. No free air. 3. Small hypodensities in the liver favored as small cysts or hemangiomas. Electronically Signed   By: Darliss Cheney M.D.   On: 02/06/2023 23:10    Pending Labs Unresulted Labs (From admission, onward)     Start     Ordered   02/07/23 0500  CBC  Tomorrow morning,   R        02/06/23 2348            Vitals/Pain Today's Vitals   02/06/23 2050 02/06/23 2132 02/06/23 2133 02/06/23 2200  BP:  (!) 142/87  135/82  Pulse:   73 71  Resp:   13 14  Temp:      TempSrc:      SpO2:   100% 100%  PainSc: 5        Isolation Precautions No active isolations  Medications Medications  fentaNYL (SUBLIMAZE) injection 50 mcg (has no administration in time range)  iohexol (OMNIPAQUE) 350 MG/ML injection 75 mL (75 mLs Intravenous  Contrast Given 02/06/23 2302)    Mobility walks     Focused Assessments Cardiac Assessment Handoff:    Lab Results  Component Value Date   TROPONINI <0.03 03/03/2019      R Recommendations: See Admitting Provider Note  Report given to:   Additional Notes:

## 2023-02-07 NOTE — Assessment & Plan Note (Addendum)
Pain started after colonoscopy and has been intermittent since She  is comfortable now and improved from admission CT showed hemoperitoneum; Abdominal US ordered for any additional info regarding potential ovarian cause for hemoperitoneum and no abnormality seen. Appreciate General surgery care and recommendations; serial exams Follow pain, vitals, hgb

## 2023-02-07 NOTE — Assessment & Plan Note (Addendum)
History of anemia, appears microcytic with MCV of 67.5.  Stable, baseline  10-11. serum iron, however normal TIBC and ferritin level.   Hx alpha thalassemia minor -PM CBC

## 2023-02-07 NOTE — Progress Notes (Signed)
PT Cancellation Note  Patient Details Name: Joy Leonard MRN: [id number] DOB: [birth year]   Cancelled Treatment:    Reason Eval/Treat Not Completed: PT screened, no needs identified, will sign off PT orders received, chart reviewed. Pt noted to be walking independently with mobility specialist. Spoke with pt who denies concerns re: mobility. PT to complete current orders, please re-consult if new needs arise.  Joy Leonard, PT, DPT 02/07/23, 11:00 AM   Sandi Mariscal 02/07/2023, 10:59 AM

## 2023-02-07 NOTE — Progress Notes (Signed)
   02/07/23 1000  Mobility  Activity Ambulated independently in hallway  Level of Assistance Independent  Assistive Device None  Distance Ambulated (ft) 550 ft  Activity Response Tolerated well  Mobility Referral Yes  $Mobility charge 1 Mobility   Mobility Specialist Progress Note  Pt was in bed and agreeable. Had no c/o pain. Returned to bed w/ all needs met and call bell in reach.   Anastasia Pall Mobility Specialist  Please contact via SecureChat or Rehab office at [phone]

## 2023-02-07 NOTE — Progress Notes (Signed)
Progress Note     Subjective: Pt reports mild lower abdominal pain with going to the bathroom, feels like a pressure sensation. Denies nausea or vomiting. Was having bowel function at home.   Objective: Vital signs in last 24 hours: Temp:  [97.5 F (36.4 C)-98.8 F (37.1 C)] 97.5 F (36.4 C) (04/09 0827) Pulse Rate:  [71-85] 78 (04/09 0827) Resp:  [13-17] 17 (04/09 0827) BP: (135-153)/(75-95) 150/91 (04/09 0827) SpO2:  [98 %-100 %] 99 % (04/09 0827) Last BM Date : 02/06/23  Intake/Output from previous day: No intake/output data recorded. Intake/Output this shift: No intake/output data recorded.  PE: General: pleasant, WD, WN female who is laying in bed in NAD Heart: regular, rate, and rhythm.   Lungs:  Respiratory effort nonlabored Abd: soft, mild ttp in lower abdomen without peritonitis, ND, +BS, no masses, hernias, or organomegaly Psych: A&Ox3 with an appropriate affect.    Lab Results:  Recent Labs    02/06/23 2021 02/07/23 0038  WBC 7.6 6.6  HGB 9.9* 10.0*  HCT 30.9* 31.0*  PLT 222 206   BMET Recent Labs    02/06/23 2021  NA 138  K 4.0  CL 102  CO2 24  GLUCOSE 103*  BUN 13  CREATININE 0.84  CALCIUM 9.1   PT/INR No results for input(s): "LABPROT", "INR" in the last 72 hours. CMP     Component Value Date/Time   NA 138 02/06/2023 2021   NA 142 10/21/2021 1652   K 4.0 02/06/2023 2021   CL 102 02/06/2023 2021   CO2 24 02/06/2023 2021   GLUCOSE 103 (H) 02/06/2023 2021   BUN 13 02/06/2023 2021   BUN 16 10/21/2021 1652   CREATININE 0.84 02/06/2023 2021   CALCIUM 9.1 02/06/2023 2021   PROT 7.1 02/06/2023 2021   PROT 6.9 07/29/2019 1002   ALBUMIN 3.8 02/06/2023 2021   ALBUMIN 4.3 07/29/2019 1002   AST 28 02/06/2023 2021   ALT 18 02/06/2023 2021   ALKPHOS 78 02/06/2023 2021   BILITOT 0.2 (L) 02/06/2023 2021   BILITOT <0.2 07/29/2019 1002   GFRNONAA >60 02/06/2023 2021   GFRAA 83 07/29/2019 1002   Lipase     Component Value Date/Time    LIPASE 26 02/06/2023 2021       Studies/Results: US PELVIS (TRANSABDOMINAL ONLY)  Result Date: 02/07/2023 CLINICAL DATA:  Pelvic pain and possible hemoperitoneum on recent CT EXAM: TRANSABDOMINAL ULTRASOUND OF PELVIS TECHNIQUE: Transabdominal ultrasound examination of the pelvis was performed including evaluation of the uterus, ovaries, adnexal regions, and pelvic cul-de-sac. COMPARISON:  CT from the previous day. FINDINGS: Uterus Measurements: 7.6 x 2.5 x 4.0 cm. = volume: 39.4 mL. No fibroids or other mass visualized. Endometrium Thickness: 2 mm.  No focal abnormality visualized. Right ovary Measurements: 3.5 x 1.4 x 1.6 cm. = volume: 4 mL. Normal appearance/no adnexal mass. Left ovary Not well visualized. Other findings: Mild free fluid is noted which is complex in nature. IMPRESSION: Poor visualization of the left ovary. Complex free fluid in the pelvis suggestive of hemoperitoneum. No discrete findings to suggest ruptured cyst are seen. Normal appearing uterus and right ovary. Electronically Signed   By: Alcide Clever M.D.   On: 02/07/2023 01:31   CT ABDOMEN PELVIS W CONTRAST  Result Date: 02/06/2023 CLINICAL DATA:  Abdominal pain post colonoscopy. EXAM: CT ABDOMEN AND PELVIS WITH CONTRAST TECHNIQUE: Multidetector CT imaging of the abdomen and pelvis was performed using the standard protocol following bolus administration of intravenous contrast. RADIATION DOSE REDUCTION:  This exam was performed according to the departmental dose-optimization program which includes automated exposure control, adjustment of the mA and/or kV according to patient size and/or use of iterative reconstruction technique. CONTRAST:  37mL OMNIPAQUE IOHEXOL 350 MG/ML SOLN COMPARISON:  None Available. FINDINGS: Lower chest: No acute abnormality. Hepatobiliary: There are scattered hypodensities in the left lobe of the liver measuring up to 11 mm favored as small cysts or hemangiomas. Otherwise, the liver, gallbladder and bile  ducts are within normal limits. Pancreas: Unremarkable. No pancreatic ductal dilatation or surrounding inflammatory changes. Spleen: Normal in size without focal abnormality. Adrenals/Urinary Tract: Adrenal glands are unremarkable. Kidneys are normal, without renal calculi, focal lesion, or hydronephrosis. Bladder is unremarkable. Stomach/Bowel: Stomach is within normal limits. Appendix appears normal. No evidence of bowel wall thickening, distention, or inflammatory changes. Vascular/Lymphatic: No significant vascular findings are present. No enlarged abdominal or pelvic lymph nodes. Reproductive: Uterus and ovaries are not well delineated on this study. Other: There is hyperdense free fluid in the cul-de-sac, moderate amount. There is additional moderate amount of mildly hyperdense free fluid throughout the pelvis surrounding the bladder dome. No free air. Small fat containing umbilical hernia. Musculoskeletal: No fracture is seen. IMPRESSION: 1. Moderate amount of mildly hyperdense free fluid in the pelvis concerning for hemoperitoneum. Uterus and ovaries are not well delineated on this study. Findings may be related to ruptured hemorrhagic ovarian cyst. Other etiologies not excluded. 2. No free air. 3. Small hypodensities in the liver favored as small cysts or hemangiomas. Electronically Signed   By: Darliss Cheney M.D.   On: 02/06/2023 23:10    Anti-infectives: Anti-infectives (From admission, onward)    None        Assessment/Plan  Hemoperitoneum s/p recent colonoscopy Probable mild splenic or splenic hilum injury - hgb stable this AM 10 from 9.9 - tolerating diet and having bowel function - if repeat hgb stable this afternoon then ok to discharge from surgery standpoint - no indication for acute surgical intervention    FEN: reg diet  VTE: SCDs ID: no current abx  LOS: 0 days   I reviewed nursing notes, hospitalist notes, last 24 h vitals and pain scores, last 48 h intake and output,  last 24 h labs and trends, and last 24 h imaging results.   Juliet Rude, [facility] 02/07/2023, 10:46 AM Please see Amion for pager number during day hours 7:00am-4:30pm

## 2023-02-07 NOTE — Progress Notes (Signed)
Discharge instructions reviewed with pt.  Copy of instructions given to pt. Script sent to [facility], and ready, pharmacy will deliver to pt's room or will be picked up on way out for discharge. Pt states her ride will be here in about 15 minutes

## 2023-02-07 NOTE — ED Provider Notes (Signed)
[facility]    CSN: [id number] Arrival date & time: 02/06/23  1846      History   Chief Complaint Chief Complaint  Patient presents with   Abdominal Pain   Post-op Problem    HPI Joy Leonard Margo Aye is a 57 y.o. female patient to the underwent screening colonoscopy about a week ago.  Following the procedure patient continues to have generalized abdominal pain.  Procedure was uneventful as per the patient's account.  She denies any nausea, vomiting or diarrhea.  No bloody stools.  She has taken several doses of Tylenol with no improvement in abdominal pain.  Pain is throbbing, constant, aggravated by bowel movement and trying to urinate.  No dysuria urgency or frequency.  No fever, chills or abdominal distention. HPI  Past Medical History:  Diagnosis Date   Borderline diabetes    Hypertension     Patient Active Problem List   Diagnosis Date Noted   Blood loss anemia 02/07/2023   Lower abdominal pain 02/07/2023   Hemoperitoneum 02/06/2023   Pacemaker 06/05/2019   Hypertension 04/20/2019   Menopause 04/20/2019   Alpha thalassaemia minor 04/20/2019   Healthcare maintenance 04/20/2019   Prediabetes 04/20/2019   Complete heart block 03/02/2019    Past Surgical History:  Procedure Laterality Date   CESAREAN SECTION  1990   PACEMAKER IMPLANT N/A 03/04/2019   Procedure: PACEMAKER IMPLANT;  Surgeon: Marinus Maw, MD;  Location: [facility];  Service: Cardiovascular;  Laterality: N/A;    OB History   No obstetric history on file.      Home Medications    Prior to Admission medications   Medication Sig Start Date End Date Taking? Authorizing Provider  acetaminophen (TYLENOL) 325 MG tablet Take 2 tablets (650 mg total) by mouth every 6 (six) hours. 02/07/23   Carrion-Carrero, Karle Starch, MD  amLODipine (NORVASC) 10 MG tablet TAKE 1 TABLET BY MOUTH EVERY DAY Patient taking differently: Take 10 mg by mouth daily. 02/25/22   Shelby Mattocks, DO   Multiple Vitamin (MULTIVITAMIN WITH MINERALS) TABS tablet Take 1 tablet by mouth daily.    [provider]  oxyCODONE (OXY IR/ROXICODONE) 5 MG immediate release tablet Take 1 tablet (5 mg total) by mouth every 4 (four) hours as needed for moderate pain. 02/07/23   McDiarmid, Leighton Roach, MD    Family History Family History  Problem Relation Age of Onset   Hypertension Mother    Diabetes Mellitus II Mother    Dementia Father    Heart disease Other        had pacemaker   Breast cancer Neg Hx     Social History Social History   Tobacco Use   Smoking status: Never   Smokeless tobacco: Never  Vaping Use   Vaping Use: Never used  Substance Use Topics   Alcohol use: Never   Drug use: Never     Allergies   Lisinopril   Review of Systems Review of Systems As per HPI  Physical Exam Triage Vital Signs ED Triage Vitals  Enc Vitals Group     BP 02/06/23 1927 137/85     Pulse Rate 02/06/23 1927 81     Resp 02/06/23 1927 17     Temp 02/06/23 1927 98.7 F (37.1 C)     Temp Source 02/06/23 1927 Oral     SpO2 02/06/23 1927 98 %     Weight --      Height --      Head  Circumference --      Peak Flow --      Pain Score 02/06/23 1926 10     Pain Loc --      Pain Edu? --      Excl. in GC? --    No data found.  Updated Vital Signs BP 137/85 (BP Location: Left Arm)   Pulse 81   Temp 98.7 F (37.1 C) (Oral)   Resp 17   LMP 09/23/2019   SpO2 98%   Visual Acuity Right Eye Distance:   Left Eye Distance:   Bilateral Distance:    Right Eye Near:   Left Eye Near:    Bilateral Near:     Physical Exam Vitals and nursing note reviewed.  Constitutional:      General: She is in acute distress.     Appearance: She is not ill-appearing.  Cardiovascular:     Rate and Rhythm: Normal rate and regular rhythm.  Pulmonary:     Effort: Pulmonary effort is normal.     Breath sounds: Normal breath sounds.  Abdominal:     General: Bowel sounds are normal.     Palpations:  Abdomen is soft. There is no hepatomegaly or splenomegaly.     Tenderness: There is generalized abdominal tenderness. There is no guarding or rebound.     Hernia: No hernia is present.  Neurological:     Mental Status: She is alert.      UC Treatments / Results  Labs (all labs ordered are listed, but only abnormal results are displayed) Labs Reviewed - No data to display  EKG   Radiology   Procedures Procedures (including critical care time)  Medications Ordered in UC Medications - No data to display  Initial Impression / Assessment and Plan / UC Course  I have reviewed the triage vital signs and the nursing notes.  Pertinent labs & imaging results that were available during my care of the patient were reviewed by me and considered in my medical decision making (see chart for details).     1.  Generalized abdominal pain status post screening colonoscopy: Patient is advised to go to the emergency department for imaging and further evaluation of abdominal pain looking for possible complications of colonoscopy. Final Clinical Impressions(s) / UC Diagnoses   Final diagnoses:  Generalized abdominal pain  Status post colonoscopy     Discharge Instructions      Given that you have had abdominal pain 1 week after colonoscopy, I will recommend you go to the emergency room for imaging.  I want to make sure that you do not have any complications from the procedure.   ED Prescriptions   None    PDMP not reviewed this encounter.   Merrilee Jansky, MD 02/07/23 [phone]

## 2023-02-07 NOTE — Assessment & Plan Note (Signed)
Continue home Amlodipine

## 2023-02-07 NOTE — Progress Notes (Signed)
Plans for pt to discharge from hospital today and have lab only apt at [facility] tomorrow for CBC to ensure hgb is stable and return for hospital f/u visit on 02/13/23 w/ PCP

## 2023-02-07 NOTE — Assessment & Plan Note (Signed)
Hx complete heart block with pacemaker in place noted

## 2023-02-07 NOTE — Discharge Summary (Cosign Needed Addendum)
Family Medicine Teaching [facility] Discharge Summary  Patient name: Joy Leonard Medical record number: [id number] Date of birth: 1966/10/14 Age: 57 y.o. Gender: female Date of Admission: 02/06/2023  Date of Discharge: 02/07/2023 Admitting Physician: Lockie Mola, MD  Primary Care Provider: Shelby Mattocks, DO Consultants: General surgery  Indication for Hospitalization: Abdominal pain  Brief Hospital Course:  Joy Leonard is a 57 y.o.female with a history of HTN, complete heart block w/ pacemaker, anemia who was admitted to the [facility] Teaching Service at [facility] for abdominal pain. Her hospital course is detailed below:  Hemoperitoneum  Patient had pain after colonoscopy on March 29th. She had 4 polyps removed at that time. Hgb on admission was 9.9 from baseline of about 11. CT abdomen pelvis significant for hemoperitoneum without any free air and pelvic ultrasound was also suggestive of hemoperitoneum with free fluid with normal appearing uterus and normal-appearing right ovary, left ovary not well-visualized. She was hemodynamically stable. General surgery was consulted and recommended overnight observation with another hemoglobin in the morning. Explained pain was most likely due to splenic injury.  Patient declined treatment with Tylenol and as needed oxycodone-did not require pain medication during hospitalization and pain improved prior to discharge.  Hemoglobin stable at 10.4 by discharge, recommend follow-up with [facility] for hemoglobin recheck tomorrow morning and follow-up with PCP in 1 week.   Other chronic conditions were medically managed with home medications and formulary alternatives as necessary (hypertension)  PCP Follow-up Recommendations: Reassess abdominal pain. Consider referral to surgery if pain persists. Repeat Hgb if indicated after initial check on 4/10  Discharge Diagnoses/Problem List:  Principal Problem:   Hemoperitoneum Active  Problems:   Complete heart block   Hypertension   Alpha thalassaemia minor   Blood loss anemia   Lower abdominal pain  Disposition: Home  Discharge Condition: Stable  Discharge Exam:  General: NAD, resting comfortably in bed Cardiovascular: RRR, no murmurs Respiratory: CTAB, normal work of breathing on room air Abdomen: Soft, not distended, mild tenderness to palpation over suprapubic and right/left lower quadrants.  Bowel sounds present Extremities: Moving all 4 extremities, no edema  Significant Procedures:  None  Significant Labs and Imaging:  Recent Labs  Lab 02/06/23 2021 02/07/23 0038 02/07/23 1108  WBC 7.6 6.6 5.6  HGB 9.9* 10.0* 10.4*  HCT 30.9* 31.0* 33.1*  PLT 222 206 217   Recent Labs  Lab 02/06/23 2021  NA 138  K 4.0  CL 102  CO2 24  GLUCOSE 103*  BUN 13  CREATININE 0.84  CALCIUM 9.1  ALKPHOS 78  AST 28  ALT 18  ALBUMIN 3.8    Results/Tests Pending at Time of Discharge: None  Discharge Medications:  Allergies as of 02/07/2023       Reactions   Lisinopril Swelling        Medication List     TAKE these medications    acetaminophen 325 MG tablet Commonly known as: TYLENOL Take 2 tablets (650 mg total) by mouth every 6 (six) hours.   amLODipine 10 MG tablet Commonly known as: NORVASC TAKE 1 TABLET BY MOUTH EVERY DAY   multivitamin with minerals Tabs tablet Take 1 tablet by mouth daily.   oxyCODONE 5 MG immediate release tablet Commonly known as: Oxy IR/ROXICODONE Take 1 tablet (5 mg total) by mouth every 4 (four) hours as needed for moderate pain.        Discharge Instructions: Please refer to Patient Instructions section of EMR for full details.  Patient was counseled important signs and symptoms that should prompt return to medical care, changes in medications, dietary instructions, activity restrictions, and follow up appointments.   DOCTOR'S APPOINTMENT         Future Appointments  Date Time Provider Department [facility]   02/08/2023 10:00 AM [facility] LAB [facility]  02/13/2023  9:50 AM Shelby Mattocks, DO [facility]  03/30/2023  7:00 AM [facility] DEVICE REMOTES [facility]  06/29/2023  7:00 AM [facility] DEVICE REMOTES [facility]  09/29/2023  7:00 AM [facility] DEVICE REMOTES [facility]    I reviewed medical decision making and verified the service and findings are accurately documented in the intern's note.  Erick Alley, DO 02/07/2023 3:36 PM  Tiffany Kocher, DO 02/07/2023, 1:32 PM PGY-1, [facility]

## 2023-02-07 NOTE — Progress Notes (Addendum)
     Daily Progress Note Intern Pager: [phone]  Patient name: Joy [facility] Medical record number: [id number] Date of birth: 1966-02-11 Age: 57 y.o. Gender: female  Primary Care Provider: Shelby Mattocks, DO Consultants: General surgery Code Status: Full code  Pt Overview and Major Events to Date:  4/8: Admitted  Assessment and Plan: Carneshia Lew Dawes Margo Aye is a 57 y.o. female presenting with abdominal pain and found to have evidence of hemoperitoneum on CT abdomen. Differential and plan as below.  * Hemoperitoneum Pain started after colonoscopy and has been intermittent since She  is comfortable now and improved from admission CT showed hemoperitoneum; Abdominal US ordered for any additional info regarding potential ovarian cause for hemoperitoneum and no abnormality seen. Appreciate General surgery care and recommendations; serial exams Follow pain, vitals, hgb   Blood loss anemia History of anemia, appears microcytic with MCV of 67.5.  Stable, baseline  10-11. serum iron, however normal TIBC and ferritin level.   Hx alpha thalassemia minor -PM CBC  Alpha thalassaemia minor Hx Alpha Thalassemia noted  Hypertension Continue home Amlodipine  Complete heart block Hx complete heart block with pacemaker in place noted   FEN/GI: Regular PPx: SCDs Dispo:Home pending clinical improvement .   Subjective:  She reports improvement of abdominal pain.  Objective: Temp:  [97.5 F (36.4 C)-98.8 F (37.1 C)] 97.5 F (36.4 C) (04/09 0827) Pulse Rate:  [71-85] 78 (04/09 0827) Resp:  [13-17] 17 (04/09 0827) BP: (135-153)/(75-95) 150/91 (04/09 0827) SpO2:  [98 %-100 %] 99 % (04/09 0827) Physical Exam: General: NAD, resting comfortably in bed Cardiovascular: RRR, no murmurs Respiratory: CTAB, normal work of breathing on room air Abdomen: Soft, not distended, mild tenderness to palpation over suprapubic and right/left lower quadrants.  Bowel sounds  present Extremities: Moving all 4 extremities, no edema  Laboratory: Most recent CBC Lab Results  Component Value Date   WBC 5.6 02/07/2023   HGB 10.4 (L) 02/07/2023   HCT 33.1 (L) 02/07/2023   MCV 68.1 (L) 02/07/2023   PLT 217 02/07/2023   Most recent BMP    Latest Ref Rng & Units 02/06/2023    8:21 PM  BMP  Glucose 70 - 99 mg/dL 496   BUN 6 - 20 mg/dL 13   Creatinine 7.59 - 1.00 mg/dL 1.63   Sodium 846 - 659 mmol/L 138   Potassium 3.5 - 5.1 mmol/L 4.0   Chloride 98 - 111 mmol/L 102   CO2 22 - 32 mmol/L 24   Calcium 8.9 - 10.3 mg/dL 9.1     HIV: Nonreactive Iron 19, TIBC 323 Ferritin 142  Imaging/Diagnostic Tests: US PELVIS (TRANSABDOMINAL ONLY) Result Date: 02/07/2023 IMPRESSION: Poor visualization of the left ovary. Complex free fluid in the pelvis suggestive of hemoperitoneum. No discrete findings to suggest ruptured cyst are seen. Normal appearing uterus and right ovary.  Tiffany Kocher, DO 02/07/2023, 1:02 PM  PGY-1, [facility] FPTS Intern pager: [phone], text pages welcome Secure chat group [facility] Teaching Service

## 2023-02-08 ENCOUNTER — Other Ambulatory Visit: Payer: Federal, State, Local not specified - PPO

## 2023-02-08 DIAGNOSIS — D500 Iron deficiency anemia secondary to blood loss (chronic): Secondary | ICD-10-CM

## 2023-02-09 ENCOUNTER — Telehealth: Payer: Self-pay

## 2023-02-09 LAB — CBC
Hematocrit: 34.7 % (ref 34.0–46.6)
Hemoglobin: 11.1 g/dL (ref 11.1–15.9)
MCH: 21.6 pg — ABNORMAL LOW (ref 26.6–33.0)
MCHC: 32 g/dL (ref 31.5–35.7)
MCV: 68 fL — ABNORMAL LOW (ref 79–97)
Platelets: 312 10*3/uL (ref 150–450)
RBC: 5.13 x10E6/uL (ref 3.77–5.28)
RDW: 17.8 % — ABNORMAL HIGH (ref 11.7–15.4)
WBC: 6.2 10*3/uL (ref 3.4–10.8)

## 2023-02-09 NOTE — Transitions of Care (Post Inpatient/ED Visit) (Signed)
   02/09/2023  Name: Joy [facility] MRN: [id number] DOB: [birth year]  Today's TOC FU Call Status: Today's TOC FU Call Status:: Successful TOC FU Call Competed TOC FU Call Complete Date: 02/09/23  Transition Care Management Follow-up Telephone Call Date of Discharge: 02/07/23 Discharge Facility: [facility]) Type of Discharge: Inpatient Admission Primary Inpatient Discharge Diagnosis:: Lower Abdominal Pain How have you been since you were released from the hospital?: Better Any questions or concerns?: No  Items Reviewed: Did you receive and understand the discharge instructions provided?: Yes Medications obtained and verified?: Yes (Medications Reviewed) Any new allergies since your discharge?: No Dietary orders reviewed?: No Do you have support at home?: Yes People in Home: sibling(s) Name of Support/Comfort Primary Source: [facility] and Equipment/Supplies: Were Home Health Services Ordered?: No Any new equipment or medical supplies ordered?: No  Functional Questionnaire: Do you need assistance with bathing/showering or dressing?: No Do you need assistance with meal preparation?: No Do you need assistance with eating?: No Do you have difficulty maintaining continence: No Do you need assistance with getting out of bed/getting out of a chair/moving?: No Do you have difficulty managing or taking your medications?: No  Follow up appointments reviewed: PCP Follow-up appointment confirmed?: Yes Date of PCP follow-up appointment?: 02/08/23 Follow-up Provider: Dr. Royal Piedra for labs [facility]ist [facility] Follow-up appointment confirmed?: NA Do you need transportation to your follow-up appointment?: No Do you understand care options if your condition(s) worsen?: Yes-patient verbalized understanding  Jodelle Gross, RN, BSN, CCM Care Management Coordinator [facility] Phone: [phone]/Fax: [phone]

## 2023-02-13 ENCOUNTER — Inpatient Hospital Stay: Payer: Self-pay | Admitting: Student

## 2023-02-14 ENCOUNTER — Ambulatory Visit: Payer: Federal, State, Local not specified - PPO | Admitting: Family Medicine

## 2023-02-14 ENCOUNTER — Telehealth: Payer: Self-pay

## 2023-02-14 VITALS — BP 142/76 | HR 92 | Ht 64.0 in | Wt 213.1 lb

## 2023-02-14 DIAGNOSIS — Z23 Encounter for immunization: Secondary | ICD-10-CM | POA: Diagnosis not present

## 2023-02-14 DIAGNOSIS — R109 Unspecified abdominal pain: Secondary | ICD-10-CM

## 2023-02-14 DIAGNOSIS — Z1159 Encounter for screening for other viral diseases: Secondary | ICD-10-CM | POA: Diagnosis not present

## 2023-02-14 DIAGNOSIS — R1030 Lower abdominal pain, unspecified: Secondary | ICD-10-CM | POA: Diagnosis not present

## 2023-02-14 LAB — POCT URINALYSIS DIP (MANUAL ENTRY)
Bilirubin, UA: NEGATIVE
Glucose, UA: NEGATIVE mg/dL
Ketones, POC UA: NEGATIVE mg/dL
Nitrite, UA: NEGATIVE
Protein Ur, POC: NEGATIVE mg/dL
Spec Grav, UA: 1.02 (ref 1.010–1.025)
Urobilinogen, UA: 0.2 E.U./dL
pH, UA: 6 (ref 5.0–8.0)

## 2023-02-14 LAB — POCT UA - MICROSCOPIC ONLY

## 2023-02-14 MED ORDER — OXYCODONE HCL 5 MG PO TABS
5.0000 mg | ORAL_TABLET | ORAL | 0 refills | Status: DC | PRN
Start: 2023-02-14 — End: 2023-02-22

## 2023-02-14 NOTE — Patient Instructions (Addendum)
It was great seeing you today!  Im sorry you are still having lower abdominal pain. We are going to check your hemoglobin levels to ensure you're bleeding is stable. We will call you if anything is abnormal.   Please call the GI office and schedule an appointment with them. They will likely want to repeat imaging.  I will send in a few more pain medication to hold you over until you can be seen by them. Still use extra strength Tylenol and use the Oxy only for break through pain.   Keep your follow up PCP appointment. If pain worsens, you experience dizziness or notice blood in urine or stool, or any other concerning symptoms please go to the ED.    Feel free to call with any questions or concerns at any time, at [phone].   Take care,  Dr. Cora Collum [facility]

## 2023-02-14 NOTE — Assessment & Plan Note (Addendum)
-   Pain has not improved since discharge from hospital on 4/9.  - Given continued and worsening of pain, hemoperitoneum on imaging and ruled out gynecological causes, will refer back to GI for assessment and further evaluation of patient's symptoms. Patient is agreeable.  - Awaiting CBC, will contact patient if hemoglobin unstable. Was 11.1 on 4/10 - Urine Analysis shows blood and leukocytes. Will send for culture. Pain is less likely to be due to urological concerns given no dysuria, no CVA tenderness on exam  - Refilled Oxycodone  x 5 pills  - ED precautions given to patient

## 2023-02-14 NOTE — Progress Notes (Cosign Needed Addendum)
SUBJECTIVE:   CHIEF COMPLAINT / HPI:   Joy [facility] in a 57 y.o. with hx of complete heart block w/ pacemaker, hypertension, and anemia who presents to clinic today for 1-week hospital follow-up for abdominal pain possible due to complication from colonoscopy w/ polyp removal on 3/29  -------- Per Hospital Discharge note on 4/8: Hemoperitoneum  Patient had pain after colonoscopy on March 29th. She had 4 polyps removed at that time. Hgb on admission was 9.9 from baseline of about 11. CT abdomen pelvis significant for hemoperitoneum without any free air and pelvic ultrasound was also suggestive of hemoperitoneum with free fluid with normal appearing uterus and normal-appearing right ovary, left ovary not well-visualized. She was hemodynamically stable. General surgery was consulted and recommended overnight observation with another hemoglobin in the morning. Explained pain was most likely due to splenic injury.  Patient declined treatment with Tylenol and as needed oxycodone-did not require pain medication during hospitalization and pain improved prior to discharge.  Hemoglobin stable at 10.4 by discharge, recommend follow-up with [facility] for hemoglobin recheck tomorrow morning and follow-up with PCP in 1 week. ----------   Patient rates pain 10/10 today. She took oxycodone  around 7 AM this morning with pain relief and was able to go back to sleep until around noon when she woke up with 10/10 pain. Has not used Tylenol today. Since discharge, she has been medicating with Tylenol  usually two times/day with mild  relief. She was excused from work yesterday because the pain worsened and was not able to work today.   Following discharge, either 2 or 3 days ago, she started having intermittent sharp LLQ and pain about 5-10 times per day. Patient states that at times, the sharp pain is brought on by trying to go to bathroom or when she coughs, but sometimes the sharp pain occurs  randomly while she's just sitting or laying down.   She has continued to have dull pain that is constant throughout the day since day of colonoscopy and following discharge from hospital last week. This dull pain worsens with urination and defecation, as well as when she presses on her abdomen.   She has felt more bloated since her colonoscopy. She has no had follow-ups with GI and does not have an appointment scheduled.   Patient denies diarrhea or constipation. She has 1-2 bowel movements per day and passes gas. Denies blood in stool. Endorses some soft stools 1-2 times since colonoscopy.  Denies fevers, chills, night sweats. Denies urgency or frequency with urination but will have burning pain in her suprapubic region when she pees. Denies any blood in urine. Denies nausea, vomiting, or decrease in appetite.   Patient denies shortness of breath, chest pain, palpitations, or dizziness.    PERTINENT  PMH / PSH:  -Complete heart block w/ pacemaker  -Hypertension, well controlled with amlodipine  - Alpha thalassemia minor  -Prediabetes    OBJECTIVE:   BP (!) 142/76   Pulse 92   Ht  (1.626 m)   Wt 213 lb 2 oz (96.7 kg)   LMP 09/23/2019   SpO2 100%   BMI 36.58 kg/m    Physical Exam:  General: well-appearing  Skin: Warm and Dry  CV: RRR Pulm: Clear to auscultation bilaterally  Abdomen: normal bowel sounds, tender to light palpation in LLQ, slightly tender to palpation in RLQ. No CVA tenderness  LE: no edema   ASSESSMENT/PLAN:   Lower abdominal pain - Pain has not improved  since discharge from hospital on 4/9.  - Given continued and worsening of pain, hemoperitoneum on imaging and ruled out gynecological causes, will refer back to GI for assessment and further evaluation of patient's symptoms. Patient is agreeable.  - Awaiting CBC, will contact patient if hemoglobin unstable. Was 11.1 on 4/10 - Urine Analysis shows blood and leukocytes. Will send for culture. Pain is less  likely to be due to urological concerns given no dysuria, no CVA tenderness on exam  - Refilled Oxycodone  x 5 pills  - ED precautions given to patient    Health maintenance Hep C screen ordered  COVID vaccine administered   Sydnee Levans, Medical Student [facility]    I was personally present and performed or re-performed the history, physical exam and medical decision making activities of this service and have verified that the service and findings are accurately documented in the student's note.  Cora Collum, DO                  02/14/2023, 4:50 PM

## 2023-02-14 NOTE — Telephone Encounter (Signed)
Patient calls nurse line reporting continued abdominal pain.   She is requesting an apt for today. She reports she has one scheduled with PCP for 4/24, however does not want to wait that long.   She reports continued lower quadrant pain on her left side. She denies any fevers, chills, nausea, vomiting, diarrhea or constipation. No bloody stools.  She does report some distention and gas.   Patient scheduled for this afternoon for evaluation. Recommended to keep PCP apt for next week.   ED precautions discussed in the meantime.

## 2023-02-15 LAB — CBC
Hematocrit: 31.2 % — ABNORMAL LOW (ref 34.0–46.6)
Hemoglobin: 9.7 g/dL — ABNORMAL LOW (ref 11.1–15.9)
MCH: 21.2 pg — ABNORMAL LOW (ref 26.6–33.0)
MCHC: 31.1 g/dL — ABNORMAL LOW (ref 31.5–35.7)
MCV: 68 fL — ABNORMAL LOW (ref 79–97)
Platelets: 289 10*3/uL (ref 150–450)
RBC: 4.57 x10E6/uL (ref 3.77–5.28)
RDW: 17.5 % — ABNORMAL HIGH (ref 11.7–15.4)
WBC: 6.6 10*3/uL (ref 3.4–10.8)

## 2023-02-15 LAB — HCV AB W REFLEX TO QUANT PCR: HCV Ab: NONREACTIVE

## 2023-02-15 LAB — HCV INTERPRETATION

## 2023-02-16 ENCOUNTER — Telehealth: Payer: Self-pay | Admitting: Family Medicine

## 2023-02-16 DIAGNOSIS — R1032 Left lower quadrant pain: Secondary | ICD-10-CM | POA: Diagnosis not present

## 2023-02-16 DIAGNOSIS — Z86010 Personal history of colonic polyps: Secondary | ICD-10-CM | POA: Diagnosis not present

## 2023-02-16 DIAGNOSIS — D509 Iron deficiency anemia, unspecified: Secondary | ICD-10-CM | POA: Diagnosis not present

## 2023-02-16 DIAGNOSIS — K429 Umbilical hernia without obstruction or gangrene: Secondary | ICD-10-CM | POA: Diagnosis not present

## 2023-02-16 LAB — URINE CULTURE

## 2023-02-16 NOTE — Telephone Encounter (Signed)
Spoke with patient about results. Hemoglobin dropped a bit but she was able to see GI doctor today who did not think bowel was perforated. Patient feeling better as well and denies any pain today. We will see her at her scheduled follow up appointment in a week

## 2023-02-20 NOTE — Progress Notes (Addendum)
I was preceptor for this office visit. Denny Levy

## 2023-02-22 ENCOUNTER — Encounter: Payer: Self-pay | Admitting: Student

## 2023-02-22 ENCOUNTER — Ambulatory Visit: Payer: Federal, State, Local not specified - PPO | Admitting: Student

## 2023-02-22 DIAGNOSIS — K661 Hemoperitoneum: Secondary | ICD-10-CM

## 2023-02-22 NOTE — Patient Instructions (Addendum)
It was great to see you today! Thank you for choosing [facility] for your primary care. Jaclyne [facility] was seen for hospital follow-up.  Today we addressed: I am sorry that I do not have a perfect answer as to what caused all of this.  I am glad you are back to normal.  Keep an eye on this and let me know if there were any return of symptoms.  Please fill out a records release form for Dr. Kenna Gilbert office so that I can have access to the colonoscopy report.  If you haven't already, sign up for My Chart to have easy access to your labs results, and communication with your primary care physician.  Call the clinic at [phone] if your symptoms worsen or you have any concerns.  You should return to our clinic Return if symptoms worsen or fail to improve. Please arrive 15 minutes before your appointment to ensure smooth check in process.  We appreciate your efforts in making this happen.  Thank you for allowing me to participate in your care, Joy Mattocks, DO 02/22/2023, 9:51 AM PGY-2, [facility]

## 2023-02-22 NOTE — Assessment & Plan Note (Addendum)
Pain improved/resolved.  Advised monitoring for any further symptomatology.  Advised patient to sign records release so I may have documentation of completed colonoscopy.

## 2023-02-22 NOTE — Progress Notes (Signed)
  SUBJECTIVE:   CHIEF COMPLAINT / HPI:   Hospital follow-up due to hemoperitoneum identified after colonoscopy.  Patient was discharged on 4/8 and seen by Dr. Idalia Needle on 4/16.  She was referred back to GI for further assessment.  She notes the GI physician told her this was from a ruptured ovarian cyst.  Regardless, her abdominal pain is almost entirely resolved (0-1/10) on pain scale.  She is not needing any further pain medication.  Denies urinary/stool difficulties in addition to nausea and vomiting.  PERTINENT  PMH / PSH: Complete heart block with pacemaker, HTN, alpha thalassemia minor, prediabetes  Patient Care Team: Shelby Mattocks, DO as PCP - General (Family Medicine) OBJECTIVE:  BP 128/78   Pulse 84   Ht  (1.626 m)   Wt 213 lb (96.6 kg)   LMP 09/23/2019   SpO2 97%   BMI 36.56 kg/m  General: Well-appearing, NAD CV: RRR, no murmurs auscultated Abdomen: Soft, nontender, normoactive bowel sounds  ASSESSMENT/PLAN:  Hemoperitoneum Assessment & Plan: Pain improved/resolved.  Advised monitoring for any further symptomatology.  Advised patient to sign records release so I may have documentation of completed colonoscopy.   Return if symptoms worsen or fail to improve. Shelby Mattocks, DO 02/22/2023, 4:02 PM PGY-2, [facility]

## 2023-03-10 ENCOUNTER — Telehealth: Payer: Self-pay | Admitting: *Deleted

## 2023-03-30 ENCOUNTER — Ambulatory Visit (INDEPENDENT_AMBULATORY_CARE_PROVIDER_SITE_OTHER): Payer: Federal, State, Local not specified - PPO

## 2023-03-30 DIAGNOSIS — I442 Atrioventricular block, complete: Secondary | ICD-10-CM

## 2023-03-30 LAB — CUP PACEART REMOTE DEVICE CHECK
Battery Remaining Longevity: 66 mo
Battery Voltage: 2.96 V
Brady Statistic AP VP Percent: 10.38 %
Brady Statistic AP VS Percent: 0 %
Brady Statistic AS VP Percent: 89.52 %
Brady Statistic AS VS Percent: 0.1 %
Brady Statistic RA Percent Paced: 10.4 %
Brady Statistic RV Percent Paced: 99.9 %
Date Time Interrogation Session: 20240530072629
Implantable Lead Connection Status: 753985
Implantable Lead Connection Status: 753985
Implantable Lead Implant Date: 20200504
Implantable Lead Implant Date: 20200504
Implantable Lead Location: 753859
Implantable Lead Location: 753860
Implantable Lead Model: 3830
Implantable Lead Model: 5076
Implantable Pulse Generator Implant Date: 20200504
Lead Channel Impedance Value: 285 Ohm
Lead Channel Impedance Value: 342 Ohm
Lead Channel Impedance Value: 380 Ohm
Lead Channel Impedance Value: 513 Ohm
Lead Channel Pacing Threshold Amplitude: 0.5 V
Lead Channel Pacing Threshold Amplitude: 2.5 V
Lead Channel Pacing Threshold Pulse Width: 0.4 ms
Lead Channel Pacing Threshold Pulse Width: 0.4 ms
Lead Channel Sensing Intrinsic Amplitude: 18.75 mV
Lead Channel Sensing Intrinsic Amplitude: 18.75 mV
Lead Channel Sensing Intrinsic Amplitude: 3.5 mV
Lead Channel Sensing Intrinsic Amplitude: 3.5 mV
Lead Channel Setting Pacing Amplitude: 1.5 V
Lead Channel Setting Pacing Amplitude: 2.5 V
Lead Channel Setting Pacing Pulse Width: 0.8 ms
Lead Channel Setting Sensing Sensitivity: 1.2 mV
Zone Setting Status: 755011
Zone Setting Status: 755011

## 2023-04-03 ENCOUNTER — Other Ambulatory Visit: Payer: Self-pay

## 2023-04-03 MED ORDER — AMLODIPINE BESYLATE 10 MG PO TABS: 10.0000 mg | ORAL_TABLET | Freq: Every day | ORAL | 3 refills | Status: DC

## 2023-04-18 NOTE — Progress Notes (Signed)
Remote pacemaker transmission.   

## 2023-06-05 IMAGING — MG MM DIGITAL SCREENING BILAT W/ TOMO AND CAD
8 series · 8 of 24 positions shown · non-contrast
Comparison: Previous exam(s).

CLINICAL DATA: Screening.

EXAM:
DIGITAL SCREENING BILATERAL MAMMOGRAM WITH TOMOSYNTHESIS AND CAD
TECHNIQUE: Bilateral screening digital craniocaudal and mediolateral oblique
mammograms were obtained. Bilateral screening digital breast
tomosynthesis was performed. The images were evaluated with
computer-aided detection.

[R MLO synth-2D]
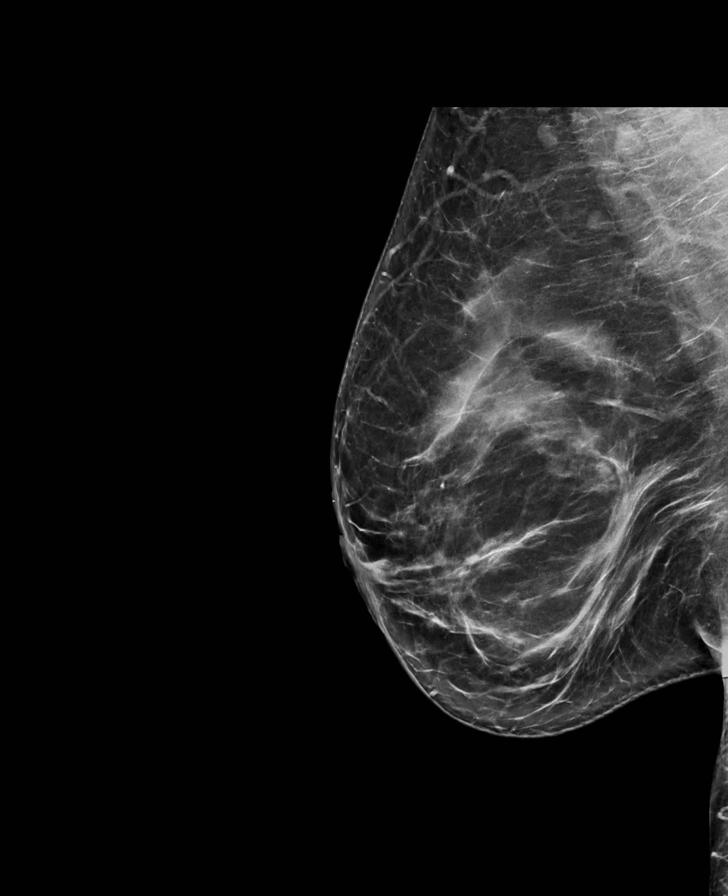

[L CC synth-2D]
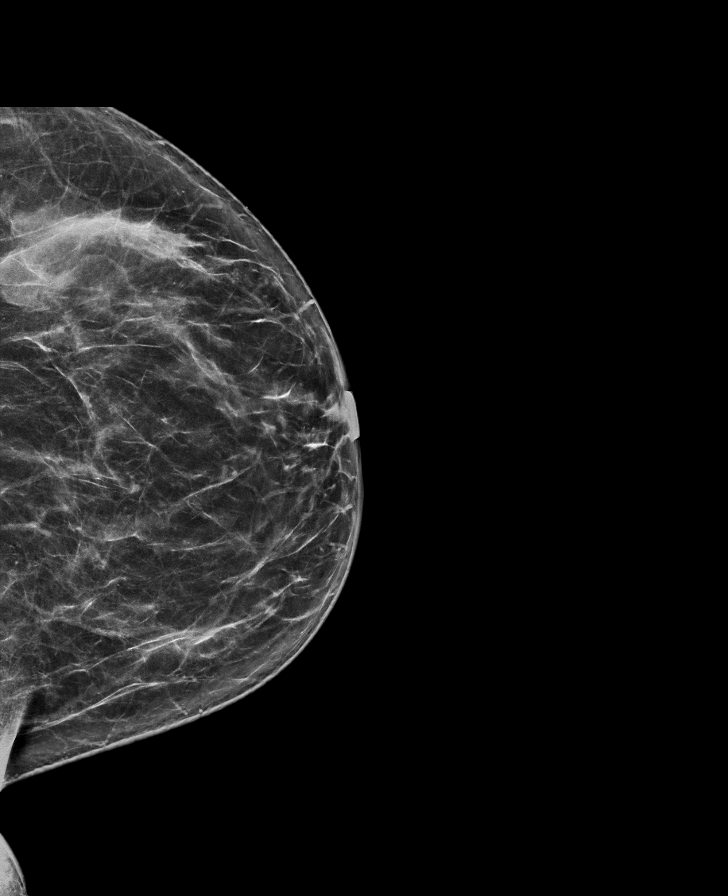

[L MLO synth-2D]
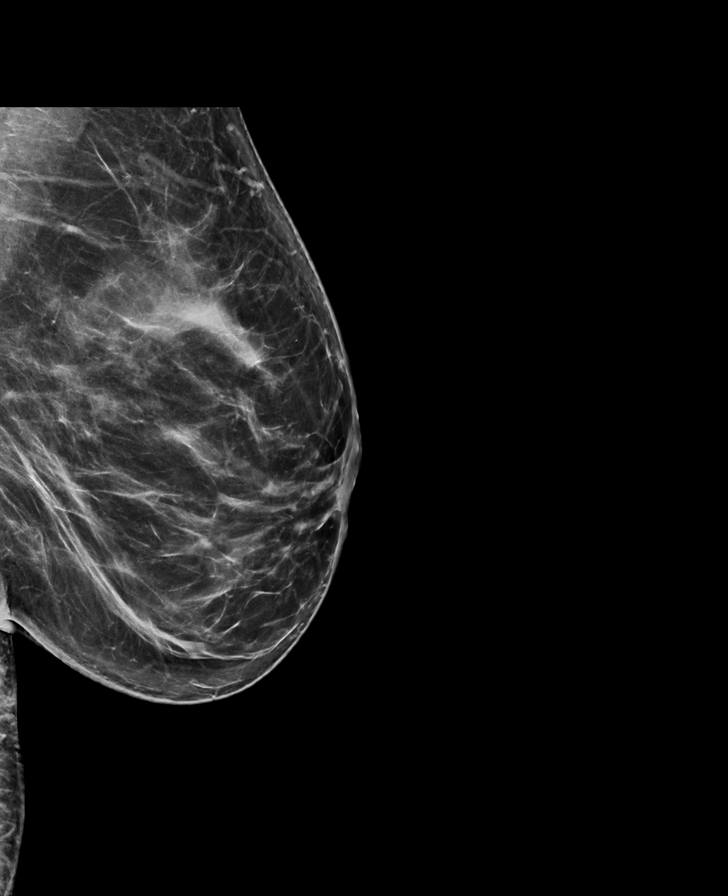

[R CC synth-2D]
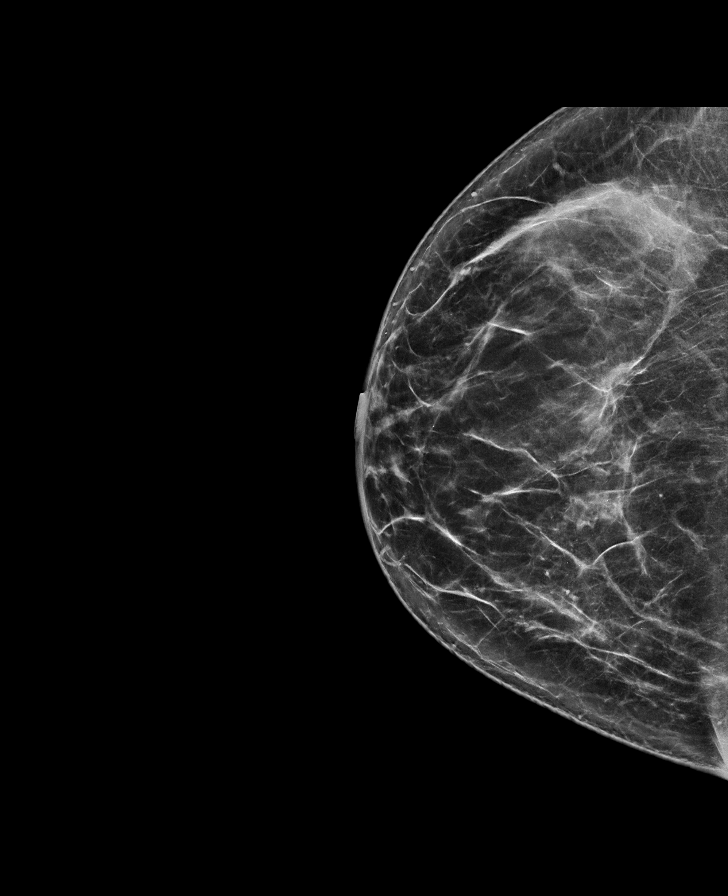

[L MLO tomo · tomo slice 45/90.0]
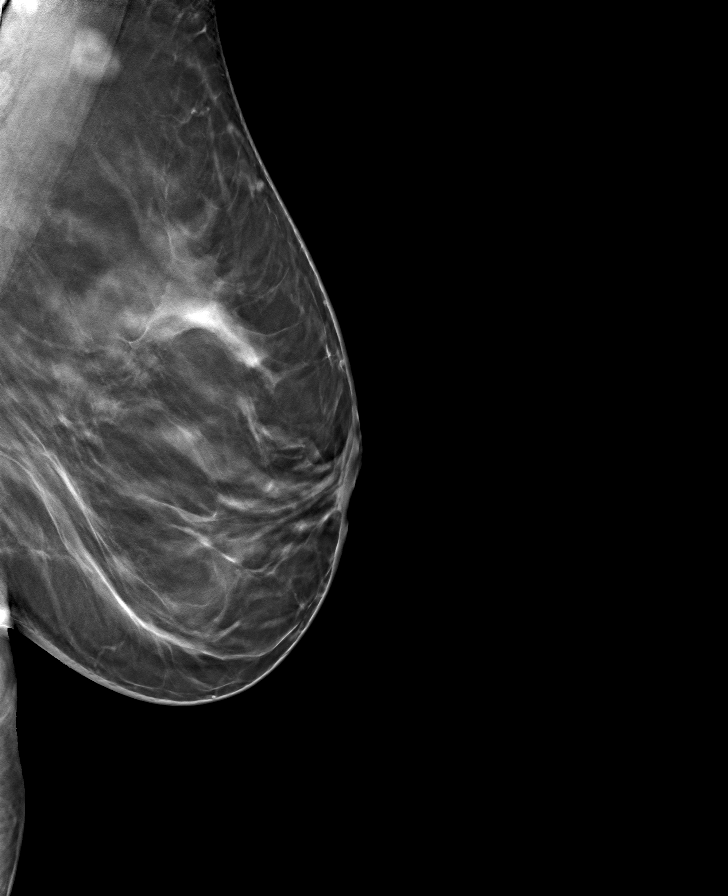

[R CC tomo · tomo slice 41/80.0]
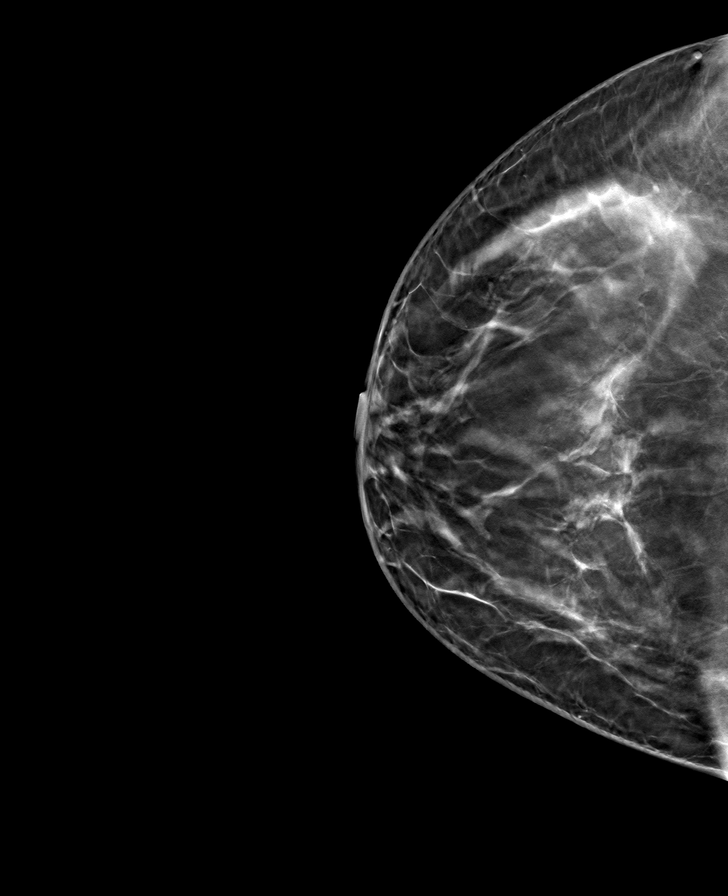

[L CC tomo · tomo slice 39/76.0]
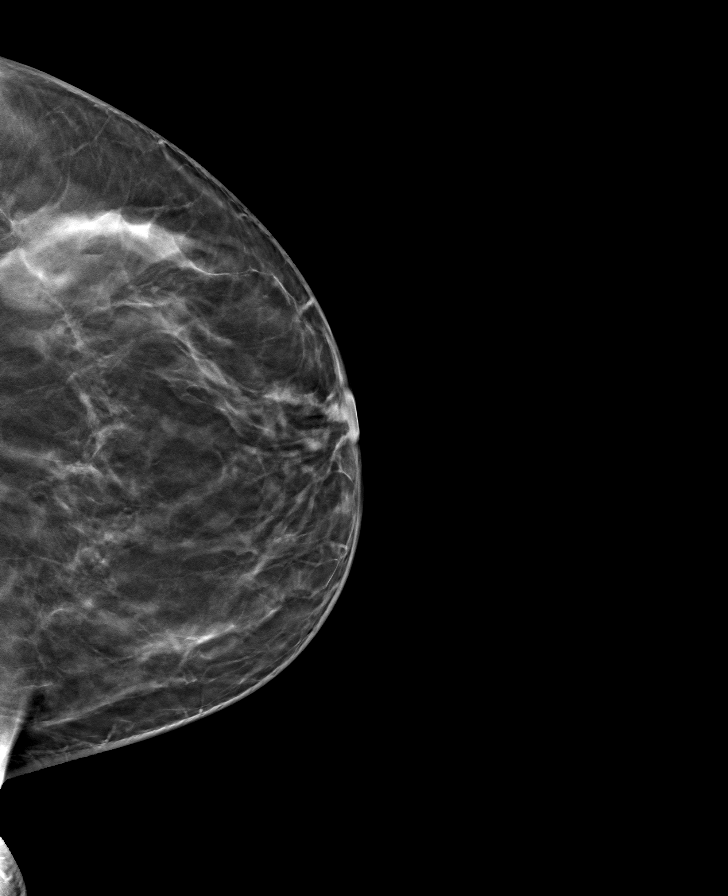

[R MLO tomo · tomo slice 46/91.0]
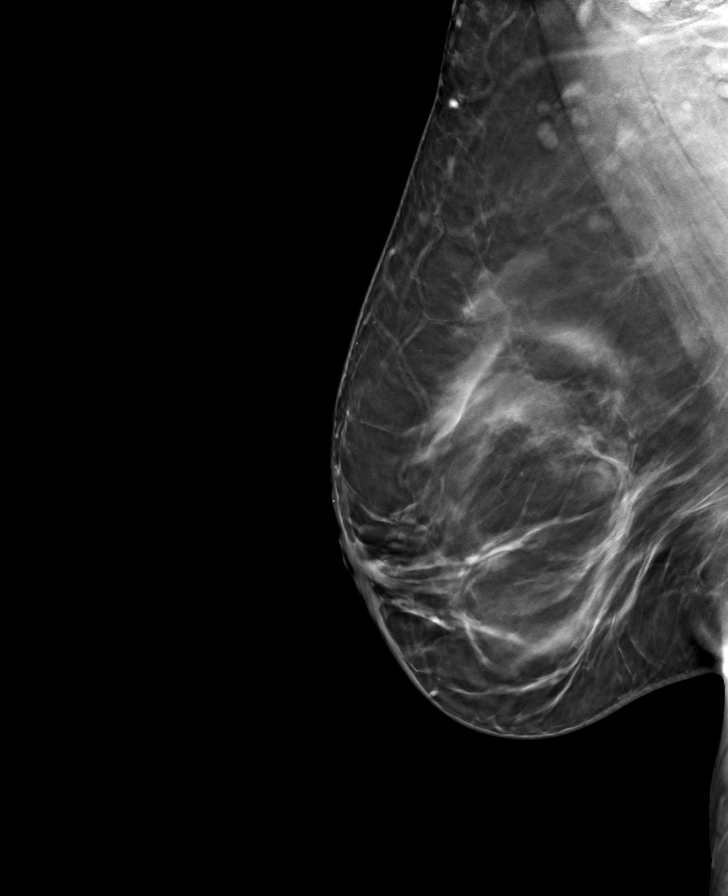

[8 of 24 positions shown; findings below may reference images not displayed]

ACR Breast Density Category b: There are scattered areas of
fibroglandular density.
FINDINGS: There are no findings suspicious for malignancy.
IMPRESSION: No mammographic evidence of malignancy. A result letter of this
screening mammogram will be mailed directly to the patient.

RECOMMENDATION:
Screening mammogram in one year. (Code:51-O-LD2)

BI-RADS CATEGORY  1: Negative.

## 2023-06-29 ENCOUNTER — Ambulatory Visit (INDEPENDENT_AMBULATORY_CARE_PROVIDER_SITE_OTHER): Payer: Federal, State, Local not specified - PPO

## 2023-06-29 ENCOUNTER — Encounter: Payer: Self-pay | Admitting: Student

## 2023-06-29 DIAGNOSIS — I442 Atrioventricular block, complete: Secondary | ICD-10-CM | POA: Diagnosis not present

## 2023-06-29 DIAGNOSIS — Z0000 Encounter for general adult medical examination without abnormal findings: Secondary | ICD-10-CM

## 2023-06-29 LAB — CUP PACEART REMOTE DEVICE CHECK
Battery Remaining Longevity: 61 mo
Battery Voltage: 2.96 V
Brady Statistic AP VP Percent: 12.64 %
Brady Statistic AP VS Percent: 0 %
Brady Statistic AS VP Percent: 87.25 %
Brady Statistic AS VS Percent: 0.11 %
Brady Statistic RA Percent Paced: 12.66 %
Brady Statistic RV Percent Paced: 99.89 %
Date Time Interrogation Session: 20240828233613
Implantable Lead Connection Status: 753985
Implantable Lead Connection Status: 753985
Implantable Lead Implant Date: 20200504
Implantable Lead Implant Date: 20200504
Implantable Lead Location: 753859
Implantable Lead Location: 753860
Implantable Lead Model: 3830
Implantable Lead Model: 5076
Implantable Pulse Generator Implant Date: 20200504
Lead Channel Impedance Value: 285 Ohm
Lead Channel Impedance Value: 361 Ohm
Lead Channel Impedance Value: 399 Ohm
Lead Channel Impedance Value: 513 Ohm
Lead Channel Pacing Threshold Amplitude: 0.5 V
Lead Channel Pacing Threshold Amplitude: 2.5 V
Lead Channel Pacing Threshold Pulse Width: 0.4 ms
Lead Channel Pacing Threshold Pulse Width: 0.4 ms
Lead Channel Sensing Intrinsic Amplitude: 22.125 mV
Lead Channel Sensing Intrinsic Amplitude: 22.125 mV
Lead Channel Sensing Intrinsic Amplitude: 3.875 mV
Lead Channel Sensing Intrinsic Amplitude: 3.875 mV
Lead Channel Setting Pacing Amplitude: 1.5 V
Lead Channel Setting Pacing Amplitude: 2.5 V
Lead Channel Setting Pacing Pulse Width: 0.8 ms
Lead Channel Setting Sensing Sensitivity: 1.2 mV
Zone Setting Status: 755011
Zone Setting Status: 755011

## 2023-07-05 NOTE — Progress Notes (Signed)
Remote pacemaker transmission.   

## 2023-07-06 ENCOUNTER — Encounter: Payer: Self-pay | Admitting: Student

## 2023-07-06 ENCOUNTER — Other Ambulatory Visit: Payer: Self-pay | Admitting: Student

## 2023-07-06 DIAGNOSIS — Z1231 Encounter for screening mammogram for malignant neoplasm of breast: Secondary | ICD-10-CM

## 2023-07-19 ENCOUNTER — Ambulatory Visit
Admission: RE | Admit: 2023-07-19 | Discharge: 2023-07-19 | Disposition: A | Discharge status: Home / Self Care | Payer: Federal, State, Local not specified - PPO | Source: Ambulatory Visit | Attending: Family Medicine | Admitting: Family Medicine

## 2023-07-19 DIAGNOSIS — Z1231 Encounter for screening mammogram for malignant neoplasm of breast: Secondary | ICD-10-CM | POA: Diagnosis not present

## 2023-09-09 ENCOUNTER — Other Ambulatory Visit: Payer: Self-pay

## 2023-09-09 ENCOUNTER — Emergency Department (HOSPITAL_COMMUNITY)

## 2023-09-09 ENCOUNTER — Encounter (HOSPITAL_COMMUNITY): Payer: Self-pay

## 2023-09-09 ENCOUNTER — Emergency Department (HOSPITAL_COMMUNITY)
Admission: EM | Admit: 2023-09-09 | Discharge: 2023-09-09 | Disposition: A | Discharge status: Home / Self Care | Attending: Emergency Medicine | Admitting: Emergency Medicine

## 2023-09-09 DIAGNOSIS — W19XXXA Unspecified fall, initial encounter: Secondary | ICD-10-CM | POA: Diagnosis not present

## 2023-09-09 DIAGNOSIS — Z79899 Other long term (current) drug therapy: Secondary | ICD-10-CM | POA: Insufficient documentation

## 2023-09-09 DIAGNOSIS — S01511A Laceration without foreign body of lip, initial encounter: Secondary | ICD-10-CM | POA: Insufficient documentation

## 2023-09-09 DIAGNOSIS — S0242XA Fracture of alveolus of maxilla, initial encounter for closed fracture: Secondary | ICD-10-CM | POA: Diagnosis not present

## 2023-09-09 DIAGNOSIS — S025XXA Fracture of tooth (traumatic), initial encounter for closed fracture: Secondary | ICD-10-CM | POA: Diagnosis not present

## 2023-09-09 DIAGNOSIS — I10 Essential (primary) hypertension: Secondary | ICD-10-CM | POA: Diagnosis not present

## 2023-09-09 DIAGNOSIS — S02401A Maxillary fracture, unspecified, initial encounter for closed fracture: Secondary | ICD-10-CM | POA: Diagnosis not present

## 2023-09-09 DIAGNOSIS — W010XXA Fall on same level from slipping, tripping and stumbling without subsequent striking against object, initial encounter: Secondary | ICD-10-CM | POA: Diagnosis not present

## 2023-09-09 DIAGNOSIS — R609 Edema, unspecified: Secondary | ICD-10-CM | POA: Diagnosis not present

## 2023-09-09 DIAGNOSIS — S0993XA Unspecified injury of face, initial encounter: Secondary | ICD-10-CM | POA: Diagnosis present

## 2023-09-09 DIAGNOSIS — R58 Hemorrhage, not elsewhere classified: Secondary | ICD-10-CM | POA: Diagnosis not present

## 2023-09-09 MED ORDER — LIDOCAINE-EPINEPHRINE-TETRACAINE (LET) TOPICAL GEL
3.0000 mL | Freq: Once | TOPICAL | Status: AC
  Administered 2023-09-09: 3 mL via TOPICAL
  Filled 2023-09-09: qty 3

## 2023-09-09 MED ORDER — TETANUS-DIPHTH-ACELL PERTUSSIS 5-2.5-18.5 LF-MCG/0.5 IM SUSY: 0.5000 mL | PREFILLED_SYRINGE | Freq: Once | INTRAMUSCULAR | Status: DC

## 2023-09-09 MED ORDER — ACETAMINOPHEN 500 MG PO TABS
1000.0000 mg | ORAL_TABLET | Freq: Once | ORAL | Status: AC
  Administered 2023-09-09: 1000 mg via ORAL
  Filled 2023-09-09: qty 2

## 2023-09-09 MED ORDER — AMOXICILLIN-POT CLAVULANATE 875-125 MG PO TABS
1.0000 | ORAL_TABLET | Freq: Once | ORAL | Status: AC
  Administered 2023-09-09: 1 via ORAL
  Filled 2023-09-09: qty 1

## 2023-09-09 MED ORDER — AMOXICILLIN-POT CLAVULANATE 875-125 MG PO TABS: 1.0000 | ORAL_TABLET | Freq: Two times a day (BID) | ORAL | 0 refills | Status: AC

## 2023-09-09 NOTE — ED Provider Triage Note (Signed)
Emergency Medicine Provider Triage Evaluation Note  Joy [facility] , a 57 y.o. female  was evaluated in triage.  Pt complains of a fall.  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  Has bleeding above the lips, says her teeth feel funny.  Review of Systems    Physical Exam  BP (!) 154/95 (BP Location: Right Arm)   Pulse 98   Temp 98.5 F (36.9 C) (Axillary)   Resp 18   LMP 09/23/2019   SpO2 97%  Gen:   Awake, no distress   Resp:  Normal effort  MSK:   Moves extremities without difficulty  Other:  Normal neurological exam.  Laceration above the upper lip, not through the vermilion border.  Small abrasion on the roof of mouth.  Medical Decision Making  Medically screening exam initiated at 2:13 PM.  Appropriate orders placed.  Merlinda Smith [facility] was informed that the remainder of the evaluation will be completed by another provider, this initial triage assessment does not replace that evaluation, and the importance of remaining in the ED until their evaluation is complete.  Nonsyncopal fall from standing.  Likely require repair above the lip.  Tetanus, imaging of the maxilla face ordered.   Anders Simmonds T, DO 09/09/23 1415

## 2023-09-09 NOTE — ED Notes (Signed)
This RN reviewed discharge instructions with patient and support person. Both verbalized understanding and denied any further questions. PT well appearing upon discharge and reports tolerable pain. Pt ambulated with stable gait to exit. Pt endorses ride home.

## 2023-09-09 NOTE — ED Triage Notes (Signed)
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 Laceration to upper lip, bleeding controlled with gauze. Denies taking any blood thinners. No LOC

## 2023-09-09 NOTE — ED Provider Notes (Signed)
EMERGENCY DEPARTMENT AT [facility] Provider Note   CSN: [id number] Arrival date & time: 09/09/23  1408     History  Chief Complaint  Patient presents with   Facial Injury    Joy Leonard is a 57 y.o. female with history of hypertension presents with concern for a laceration.  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  Bleeding well-controlled upon arrival.  Denies any loss of consciousness.  She is not on any blood thinners.  Tetanus updated within the last year   Facial Injury      Home Medications Prior to Admission medications   Medication Sig Start Date End Date Taking? Authorizing Provider  amoxicillin-clavulanate (AUGMENTIN) 875-125 MG tablet Take 1 tablet by mouth every 12 (twelve) hours for 7 days. 09/09/23 09/16/23 Yes Arabella Merles, PA-C  amLODipine (NORVASC) 10 MG tablet Take 1 tablet (10 mg total) by mouth daily. 04/03/23   Shelby Mattocks, DO  Multiple Vitamin (MULTIVITAMIN WITH MINERALS) TABS tablet Take 1 tablet by mouth daily.    [provider]      Allergies    Lisinopril    Review of Systems   Review of Systems  Skin:  Positive for wound.    Physical Exam Updated Vital Signs BP (!) 154/95 (BP Location: Right Arm)   Pulse 98   Temp 98.5 F (36.9 C) (Axillary)   Resp 20   LMP 09/23/2019   SpO2 97%  Physical Exam Vitals and nursing note reviewed.  Constitutional:      Appearance: Normal appearance.  HENT:     Head: Atraumatic.  Pulmonary:     Effort: Pulmonary effort is normal.  Skin:    Comments: 2 cm laceration just above the vermilion border of the upper lip, laceration does not extend into the vermilion border  1.5 centimeter laceration to the inner upper lip, does not extend all the way through to the other side  No obvious foreign debris, bleeding well-controlled upon arrival  Neurological:     General: No focal deficit present.     Mental Status: She is  alert.  Psychiatric:        Mood and Affect: Mood normal.        Behavior: Behavior normal.          ED Results / Procedures / Treatments   Labs (all labs ordered are listed, but only abnormal results are displayed) Labs Reviewed - No data to display  EKG None  Radiology CT Maxillofacial Wo Contrast  Result Date: 09/09/2023 CLINICAL DATA:  Ataxia.  Head trauma. EXAM: CT MAXILLOFACIAL WITHOUT CONTRAST TECHNIQUE: Multidetector CT imaging of the maxillofacial structures was performed. Multiplanar CT image reconstructions were also generated. RADIATION DOSE REDUCTION: This exam was performed according to the departmental dose-optimization program which includes automated exposure control, adjustment of the mA and/or kV according to patient size and/or use of iterative reconstruction technique. COMPARISON:  None Available. FINDINGS: Osseous: There is a small fracture along the alveolar ridge overlying the upper central incisors, image 45/4. There is increased lucency about the right central incisor. No additional fracture identified. The mandible appears intact and located. Orbits: Negative. No traumatic or inflammatory finding. Sinuses: Moderate, chronic appearing bilateral maxillary sinus and ethmoid air cell mucosal thickening. Mild mucosal thickening identified within the sphenoid sinus and frontal sinuses. Air-fluid levels are noted within bilateral maxillary sinuses. Soft tissues: No significant hematoma identified. Limited intracranial: No significant or unexpected finding. IMPRESSION:  1. Small fracture along the alveolar ridge overlying the upper central incisors. 2. Increased lucency about the right central incisor. 3. Paranasal sinus disease. Electronically Signed   By: Signa Kell M.D.   On: 09/09/2023 15:52    Procedures .Marland KitchenLaceration Repair  Date/Time: 09/09/2023 5:01 PM  Performed by: Arabella Merles, PA-C Authorized by: Arabella Merles, PA-C   Consent:    Consent  obtained:  Verbal   Consent given by:  Patient   Risks, benefits, and alternatives were discussed: yes     Risks discussed:  Infection, pain and poor cosmetic result   Alternatives discussed:  No treatment Universal protocol:    Procedure explained and questions answered to patient or proxy's satisfaction: yes     Imaging studies available: yes     Patient identity confirmed:  Verbally with patient Anesthesia:    Anesthesia method:  Topical application   Topical anesthetic:  LET Laceration details:    Location:  Lip   Lip location:  Upper exterior lip   Length (cm):  2.5 Treatment:    Area cleansed with:  Soap and water   Amount of cleaning:  Standard   Irrigation solution:  Sterile water   Debridement:  None   Undermining:  None Skin repair:    Repair method:  Sutures   Suture size:  6-0   Suture material:  Prolene   Suture technique:  Simple interrupted   Number of sutures:  3 Approximation:    Vermilion border well-aligned: yes   Repair type:    Repair type:  Simple Post-procedure details:    Dressing:  Antibiotic ointment and non-adherent dressing   Procedure completion:  Tolerated well, no immediate complications     Medications Ordered in ED Medications  Tdap (BOOSTRIX) injection 0.5 mL (0.5 mLs Intramuscular Not Given 09/09/23 1429)  lidocaine-EPINEPHrine-tetracaine (LET) topical gel (3 mLs Topical Given 09/09/23 1429)  acetaminophen (TYLENOL) tablet 1,000 mg (1,000 mg Oral Given 09/09/23 1426)  amoxicillin-clavulanate (AUGMENTIN) 875-125 MG per tablet 1 tablet (1 tablet Oral Given 09/09/23 1639)    ED Course/ Medical Decision Making/ A&P                                 Medical Decision Making Amount and/or Complexity of Data Reviewed Radiology: ordered.  Risk OTC drugs. Prescription drug management.     Differential diagnosis includes but is not limited to laceration, fracture, intracranial hemorrhage   ED Course:  Patient overall well-appearing,  no acute distress.  Vital stable except for a slightly elevated blood pressure of 154/95 upon arrival.  She is a good story for mechanical fall, tripped over the curb while delivering mail.  Denies any dizziness or chest pain before the fall.  Sustained 2.5 cm laceration to the upper lip right above vermilion border, not extending down into the vermilion border.  Also has a 1.5 cm laceration to the inner upper lip, does not extend all the way through.  No loss of dentition.  The laceration was repaired between Dr. Andria Meuse and I.  3 sutures were placed.  Patient reports she has had her tetanus updated within the last year, tetanus booster not indicated at this time. Given the mechanism of injury, CT maxillofacial was obtained which showed alveolar ridge fracture.  No signs of intracranial hemorrhage or other skull fractures. No dental on-call currently, so reach out to ENT.  Dr. Elijah Birk with ENT recommended follow-up with oral surgery on  Monday, and soft food diet in the meantime.  She has been provided with the contact information of oral surgeon Dr. Kenney Houseman.  She was given her first dose of Augmentin here.  Discharged with 7-day course of Augmentin.   Impression: Laceration to upper outer lip Laceration to inner upper lip Alveolar ridge fracture  Disposition:  The patient was discharged home with instructions to take full course of Augmentin.  Keep laceration clean with soap and water.  Get sutures removed in 5 days.  Follow-up with oral surgery on Monday.  Soft food diet until cleared by oral surgery. Return precautions given.  Imaging Studies ordered: I ordered imaging studies including CT maxillofacial I independently visualized the imaging with scope of interpretation limited to determining acute life threatening conditions related to emergency care. Imaging showed small fracture along the alveolar ridge overlying upper central incisors, nondisplaced I agree with the radiologist  interpretation   Consultations Obtained: I requested consultation with the ENT Dr. Elijah Birk,  and discussed lab and imaging findings as well as pertinent plan - they recommend: Follow-up with oral surgery on Monday, soft food diet until cleared by oral surgery                Final Clinical Impression(s) / ED Diagnoses Final diagnoses:  Closed fracture of alveolar process of maxilla, initial encounter (HCC)  Lip laceration, initial encounter    Rx / DC Orders ED Discharge Orders          Ordered    amoxicillin-clavulanate (AUGMENTIN) 875-125 MG tablet  Every 12 hours        09/09/23 1656              Arabella Merles, PA-C 09/09/23 1710    Anders Simmonds T, DO 09/10/23 1110

## 2023-09-09 NOTE — Discharge Instructions (Addendum)
You must get your sutures removed in 5 days.  You had 3 sutures placed in your lip today.  We recommend visiting your PCP or an urgent care for suture removal. However, you may also return back to the ER if you are unable to be seen by your PCP or at urgent care.   You may gently clean the area around your laceration as needed with soap and water. Place antibiotic ointment such as bacitracin or neosporin over your laceration after cleaning the area.  Keep the laceration covered with sterile gauze as shown here if you are doing an activity in which it may get dirty. You may pick these supplies up at any drugstore.  Do not submerge your laceration in water (no baths, swimming) until it is fully healed. You may shower.   Your CT scan showed that you had a fracture of the bone where your tooth inserts.  You need to schedule appointment with the oral surgeon Dr. Kenney Houseman on Monday for follow-up appointment.  Their contact information is listed below.  Please eat a soft food diet, no foods that require chewing until you are cleared by oral surgery.  You have been prescribed Augmentin. Take this antibiotic 2 times a day for the next 7 days. Take the full course of your antibiotic even if you start feeling better. Antibiotics may cause you to have diarrhea.  You were given your first dose here today.  Return to the ER should you develop fever, chills, pus drainage from your wound, redness around your wound.    Your CT read is as below: "IMPRESSION: 1. Small fracture along the alveolar ridge overlying the upper central incisors. 2. Increased lucency about the right central incisor." 3. Paranasal sinus disease.

## 2023-09-09 NOTE — ED Notes (Signed)
PA at bedside.

## 2023-09-14 ENCOUNTER — Ambulatory Visit: Payer: Federal, State, Local not specified - PPO | Admitting: Student

## 2023-09-14 ENCOUNTER — Encounter: Payer: Self-pay | Admitting: Student

## 2023-09-14 VITALS — BP 130/88 | HR 91 | Ht 64.0 in | Wt 211.4 lb

## 2023-09-14 DIAGNOSIS — S0242XD Fracture of alveolus of maxilla, subsequent encounter for fracture with routine healing: Secondary | ICD-10-CM

## 2023-09-14 DIAGNOSIS — S01511D Laceration without foreign body of lip, subsequent encounter: Secondary | ICD-10-CM | POA: Diagnosis not present

## 2023-09-14 MED ORDER — NAPROXEN 500 MG PO TABS
500.0000 mg | ORAL_TABLET | Freq: Two times a day (BID) | ORAL | 0 refills | Status: DC
Start: 2023-09-14 — End: 2023-11-20

## 2023-09-14 NOTE — Patient Instructions (Signed)
Ms. Joy Leonard,  [place] to see you! So sorry about your face. I'm sending in Naprosyn for you which is a strong antiinflammatory drug that should help with both the pain and the local swelling/inflammation which should help with your eating/drinking and talking.   Eliezer Mccoy, MD

## 2023-09-15 DIAGNOSIS — S01511A Laceration without foreign body of lip, initial encounter: Secondary | ICD-10-CM | POA: Insufficient documentation

## 2023-09-15 NOTE — Assessment & Plan Note (Signed)
Her injuries appear to be healing well.  Unfortunately, the degree of swelling she is having is impeding her ability to speak clearly, eat, or drink.  Therefore favor treating her with NSAIDs for anti-inflammatory benefit.  Also, given the location of her injuries and the potential for poor cosmetic outcomes, would favor having her seen by plastic surgery. -Naproxen 500 mg twice daily as needed -Ambulatory referral to plastic surgery -The 3 external 6-0 Prolene sutures were removed in clinic today.  The patient tolerated the procedure well.

## 2023-09-15 NOTE — Progress Notes (Signed)
    SUBJECTIVE:   CHIEF COMPLAINT / HPI:   Facial Fracture  Lip Laceration Here to follow up after a fall on 11/09.  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  She presented to the ED where she was found to have a alveolar ridge fracture overlying the upper central incisors and both internal and external upper lip lacerations.  The internal laceration was hemostatic at this time and therefore was not repaired in the ED.  The external laceration was just superior to the vermilion border and did not cross vermilion border.  This was closed using three 6-0 Prolene sutures and she is here today to have them removed. She reports impairment in her ability to eat and drink secondary to the lip swelling, she tells me that the pain is not as bad as the swelling.  PERTINENT  PMH / PSH: CHB s/p pacemaker placement  OBJECTIVE:   BP 130/88   Pulse 91   Ht 5\' 4"  (1.626 m)   Wt 211 lb 6.4 oz (95.9 kg)   LMP 09/23/2019   SpO2 100%   BMI 36.29 kg/m   Gen: Well appearing and NAD HENT: Central incisors are not loose or discolored. Obvious swelling to the upper lip that muffles speech and impairs ability to fully open mouth. The external lip laceration appears to be healing well. Internal laceration likewise appears to be healing well. There is mild tenderness to palpation of the lip.  ASSESSMENT/PLAN:   Lip laceration Her injuries appear to be healing well.  Unfortunately, the degree of swelling she is having is impeding her ability to speak clearly, eat, or drink.  Therefore favor treating her with NSAIDs for anti-inflammatory benefit.  Also, given the location of her injuries and the potential for poor cosmetic outcomes, would favor having her seen by plastic surgery. -Naproxen 500 mg twice daily as needed -Ambulatory referral to plastic surgery -The 3 external 6-0 Prolene sutures were removed in clinic today.  The patient tolerated the procedure well.     Eliezer Mccoy, MD [facility]

## 2023-09-18 ENCOUNTER — Encounter: Payer: Self-pay | Admitting: Plastic Surgery

## 2023-09-18 ENCOUNTER — Ambulatory Visit (INDEPENDENT_AMBULATORY_CARE_PROVIDER_SITE_OTHER): Payer: Self-pay | Admitting: Plastic Surgery

## 2023-09-18 VITALS — BP 159/94 | HR 84 | Ht 64.0 in | Wt 214.8 lb

## 2023-09-18 DIAGNOSIS — L905 Scar conditions and fibrosis of skin: Secondary | ICD-10-CM

## 2023-09-18 NOTE — Progress Notes (Signed)
Referring Provider Shelby Mattocks, DO [address] [place],  [place] [address]   CC:  Chief Complaint  Patient presents with   Consult      Joy Leonard is an 57 y.o. female.  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  On November 9.  She was seen in the emergency department where the external laceration was sutured and a CT scan of the face was performed.  She was seen by her primary care doctor approximately 3 days after the injury.  They removed the external sutures but were uncomfortable with managing the inner portion of the lip.  She was also seen by an oral maxillofacial surgeon for a small alveolar ridge fracture.  Allergies  Allergen Reactions   Lisinopril Swelling    Outpatient Encounter Medications as of 09/18/2023  Medication Sig   amLODipine (NORVASC) 10 MG tablet Take 1 tablet (10 mg total) by mouth daily.   Multiple Vitamin (MULTIVITAMIN WITH MINERALS) TABS tablet Take 1 tablet by mouth daily.   naproxen (NAPROSYN) 500 MG tablet Take 1 tablet (500 mg total) by mouth 2 (two) times daily with a meal.   [EXPIRED] amoxicillin-clavulanate (AUGMENTIN) 875-125 MG tablet Take 1 tablet by mouth every 12 (twelve) hours for 7 days.   No facility-administered encounter medications on file as of 09/18/2023.     Past Medical History:  Diagnosis Date   Borderline diabetes    Hypertension     Past Surgical History:  Procedure Laterality Date   CESAREAN SECTION  1990   PACEMAKER IMPLANT N/A 03/04/2019   Procedure: PACEMAKER IMPLANT;  Surgeon: Marinus Maw, MD;  Location: [facility] INVASIVE CV LAB;  Service: Cardiovascular;  Laterality: N/A;    Family History  Problem Relation Age of Onset   Hypertension Mother    Diabetes Mellitus II Mother    Dementia Father    Heart disease Other        had pacemaker   Breast cancer Neg Hx     Social History   Social History  Narrative   Not on file     Review of Systems General: Denies fevers, chills, weight loss CV: Denies chest pain, shortness of breath, palpitations Face: History of maxillofacial trauma with a lip laceration and alveolar ridge fracture  Physical Exam    09/18/2023    9:51 AM 09/14/2023    2:26 PM 09/14/2023    2:09 PM  Vitals with BMI  Height 5\' 4"   5\' 4"   Weight 214 lbs 13 oz  211 lbs 6 oz  BMI 36.85  36.27  Systolic 159 130 604  Diastolic 94 88 92  Pulse 84  91    General:  No acute distress,  Alert and oriented, Non-Toxic, Normal speech and affect Lip: The lip is swollen however the external laceration is well-approximated and well-healed.  She has what looks like a small skin flap which was created when her lip struck her incisors and now there is a small protruding scar on the inside of the lip. Mammogram: Not applicable Assessment/Plan Lip laceration: Patient had a lip laceration which was managed in the emergency department.  The inner portion of the lip laceration seems to have curled and caused a protruding scar.  The area where the tissue created a flap has healed nicely.  There are no open wounds in the mouth.  I discussed with the  patient the fact that this needed to heal and the swelling need to go down before we could determine if the protruding scar needed to be revised.  This will take 2 to 3 months minimum.  I have encouraged her just to be careful with the protruding scar and to return and see me in 2 months at which time we will reevaluate and determine whether she needs to be taken to the operating room for scar revision.  All questions were answered to her satisfaction and she was happy with this plan.  I spent 30 minutes reviewing the patient's chart and CT scans, examining the patient and discussing the management with her, and documenting.  Joy Leonard 09/18/2023, 11:04 AM

## 2023-09-21 ENCOUNTER — Ambulatory Visit: Payer: Federal, State, Local not specified - PPO | Attending: Internal Medicine | Admitting: Internal Medicine

## 2023-09-21 ENCOUNTER — Encounter: Payer: Self-pay | Admitting: Internal Medicine

## 2023-09-21 VITALS — BP 140/86 | HR 87 | Ht 64.0 in | Wt 210.0 lb

## 2023-09-21 DIAGNOSIS — I442 Atrioventricular block, complete: Secondary | ICD-10-CM | POA: Diagnosis not present

## 2023-09-21 LAB — CUP PACEART INCLINIC DEVICE CHECK
Date Time Interrogation Session: 20241121155825
Implantable Lead Connection Status: 753985
Implantable Lead Connection Status: 753985
Implantable Lead Implant Date: 20200504
Implantable Lead Implant Date: 20200504
Implantable Lead Location: 753859
Implantable Lead Location: 753860
Implantable Lead Model: 3830
Implantable Lead Model: 5076
Implantable Pulse Generator Implant Date: 20200504

## 2023-09-21 NOTE — Patient Instructions (Signed)

## 2023-09-21 NOTE — Progress Notes (Signed)
HPI [name] returns today for followup. She is a pleasant 57 yo woman with a h/o HTN, DM, high grade heart block, s/p PPM insertion. She denies chest pain or sob. No edema. She has not had syncope since her device was placed. 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  She recently tripped and fell at work. No syncope.  Allergies  Allergen Reactions   Lisinopril Swelling     Current Outpatient Medications  Medication Sig Dispense Refill   amLODipine (NORVASC) 10 MG tablet Take 1 tablet (10 mg total) by mouth daily. 90 tablet 3   Multiple Vitamin (MULTIVITAMIN WITH MINERALS) TABS tablet Take 1 tablet by mouth daily.     naproxen (NAPROSYN) 500 MG tablet Take 1 tablet (500 mg total) by mouth 2 (two) times daily with a meal. 60 tablet 0   No current facility-administered medications for this visit.     Past Medical History:  Diagnosis Date   Borderline diabetes    Hypertension     ROS:   All systems reviewed and negative except as noted in the HPI.   Past Surgical History:  Procedure Laterality Date   CESAREAN SECTION  1990   PACEMAKER IMPLANT N/A 03/04/2019   Procedure: PACEMAKER IMPLANT;  Surgeon: Marinus Maw, MD;  Location: [facility];  Service: Cardiovascular;  Laterality: N/A;     Family History  Problem Relation Age of Onset   Hypertension Mother    Diabetes Mellitus II Mother    Dementia Father    Heart disease Other        had pacemaker   Breast cancer Neg Hx      Social History   Socioeconomic History   Marital status: Single    Spouse name: Not on file   Number of children: Not on file   Years of education: Not on file   Highest education level: GED or equivalent  Occupational History   Not on file  Tobacco Use   Smoking status: Never   Smokeless tobacco: Never  Vaping Use   Vaping status: Never Used  Substance and Sexual Activity   Alcohol use: Never   Drug use: Never   Sexual activity: Not on file  Other Topics  Concern   Not on file  Social History Narrative   Not on file   Social Determinants of Health   Financial Resource Strain: Low Risk  (02/19/2023)   Overall Financial Resource Strain (CARDIA)    Difficulty of Paying Living Expenses: Not hard at all  Food Insecurity: No Food Insecurity (02/19/2023)   Hunger Vital Sign    Worried About Running Out of Food in the Last Year: Never true    Ran Out of Food in the Last Year: Never true  Transportation Needs: No Transportation Needs (02/19/2023)   PRAPARE - Administrator, Civil Service (Medical): No    Lack of Transportation (Non-Medical): No  Physical Activity: Sufficiently Active (02/19/2023)   Exercise Vital Sign    Days of Exercise per Week: 5 days    Minutes of Exercise per Session: 150+ min  Stress: No Stress Concern Present (02/19/2023)   [employer] of Occupational Health - Occupational Stress Questionnaire    Feeling of Stress : Not at all  Social Connections: Moderately Isolated (02/19/2023)   Social Connection and Isolation Panel [NHANES]    Frequency of Communication with Friends and Family: More than three times a week  Frequency of Social Gatherings with Friends and Family: More than three times a week    Attends Religious Services: 1 to 4 times per year    Active Member of [employer] or Organizations: No    Attends Engineer, structural: Not on file    Marital Status: Divorced  Intimate Partner Violence: Not At Risk (03/02/2019)   Humiliation, Afraid, Rape, and Kick questionnaire    Fear of Current or Ex-Partner: No    Emotionally Abused: No    Physically Abused: No    Sexually Abused: No     BP (!) 140/86   Pulse 87   Ht 5\' 4"  (1.626 m)   Wt 210 lb (95.3 kg)   LMP 09/23/2019   SpO2 98%   BMI 36.05 kg/m   Physical Exam:  Well appearing NAD HEENT: Unremarkable except the upper lip is swollen. Neck:  No JVD, no thyromegally Lymphatics:  No adenopathy Back:  No CVA tenderness Lungs:   Clear HEART:  Regular rate rhythm, no murmurs, no rubs, no clicks Abd:  soft, positive bowel sounds, no organomegally, no rebound, no guarding Ext:  2 plus pulses, no edema, no cyanosis, no clubbing Skin:  No rashes no nodules Neuro:  CN II through XII intact, motor grossly intact  EKG - P synchronous ventricular pacing  DEVICE  Normal device function.  See PaceArt for details.   Assess/Plan:  Heart block - she is conducting today. She paces in the ventricle most of the time.  HTN - her bp is fairly well controlled. She is encouraged to avoid salty foods and continue her current meds. PPM - her medtronic DDD PM is working normally.      Sharlot Gowda Torah Pinnock,MD

## 2023-09-22 ENCOUNTER — Ambulatory Visit (INDEPENDENT_AMBULATORY_CARE_PROVIDER_SITE_OTHER): Admitting: Student

## 2023-09-22 ENCOUNTER — Encounter: Payer: Self-pay | Admitting: Student

## 2023-09-22 VITALS — BP 136/90 | HR 80 | Ht 64.0 in | Wt 214.2 lb

## 2023-09-22 DIAGNOSIS — S01511D Laceration without foreign body of lip, subsequent encounter: Secondary | ICD-10-CM

## 2023-09-22 DIAGNOSIS — I10 Essential (primary) hypertension: Secondary | ICD-10-CM | POA: Diagnosis not present

## 2023-09-22 DIAGNOSIS — R7303 Prediabetes: Secondary | ICD-10-CM

## 2023-09-22 NOTE — Assessment & Plan Note (Addendum)
BP: (!) 136/90 today.  Diastolic just above goal. Goal of <140/90. Continue to work on healthy dietary habits and exercise. Follow up in 1 month. Medication regimen: Amlodipine 10 mg daily

## 2023-09-22 NOTE — Progress Notes (Signed)
  SUBJECTIVE:   CHIEF COMPLAINT / HPI:   Presents today for reevaluation regarding her lip laceration.  She notes that dentistry is going to fix her chipped tooth but needs the swelling to return back to normal.  She will return to see plastic surgery in 2 to 3 months.  PERTINENT  PMH / PSH: Complete heart block with pacemaker, HTN, alpha thalassemia minor, prediabetes  OBJECTIVE:  BP (!) 136/90   Pulse 80   Ht 5\' 4"  (1.626 m)   Wt 214 lb 3.2 oz (97.2 kg)   LMP 09/23/2019   SpO2 100%   BMI 36.77 kg/m  General: Well-appearing, NAD HEENT: Swollen external lip in the superior portion with swelling of the vermilion border and cupids bow, no further open laceration of the anterior lip  ASSESSMENT/PLAN:   Assessment & Plan Lip laceration, subsequent encounter While healing although still quite swollen and affecting her speech.  I am agreeable that she can remain out of work until Tuesday.  She has follow-ups planned with dentistry and plastic surgery for further care and revision, respectively, as needed. Hypertension, unspecified type BP: (!) 136/90 today.  Diastolic just above goal. Goal of <140/90. Continue to work on healthy dietary habits and exercise. Follow up in 1 month. Medication regimen: Amlodipine 10 mg daily Return in about 1 month (around 10/22/2023) for A1c and Pap. Shelby Mattocks, DO 09/22/2023, 5:24 PM PGY-3, [facility]

## 2023-09-22 NOTE — Assessment & Plan Note (Signed)
While healing although still quite swollen and affecting her speech.  I am agreeable that she can remain out of work until Tuesday.  She has follow-ups planned with dentistry and plastic surgery for further care and revision, respectively, as needed.

## 2023-09-22 NOTE — Patient Instructions (Signed)
It was great to see you today! Thank you for choosing [facility] for your primary care.  Today we addressed: I have provided a note regarding being out of work for your lip laceration.  If you need me to fill out Worker's Comp., please let me know and make sure the paperwork from your superiors are filled out prior to having my office complete our portion. When you return, we will do A1c regarding her prediabetes and a Pap smear.  If you haven't already, sign up for My Chart to have easy access to your labs results, and communication with your primary care physician.  Return in about 1 month (around 10/22/2023) for A1c and Pap. Please arrive 15 minutes before your appointment to ensure smooth check in process.  We appreciate your efforts in making this happen.  Thank you for allowing me to participate in your care, Shelby Mattocks, DO 09/22/2023, 3:28 PM PGY-3, [facility]

## 2023-09-25 ENCOUNTER — Telehealth: Payer: Self-pay | Admitting: *Deleted

## 2023-09-25 NOTE — Telephone Encounter (Signed)
Transition Care Management Follow-up Telephone Call Date of discharge and from where: [facility]. [facility]  09/09/2023 How have you been since you were released from the hospital?Much better than that day  Any questions or concerns? No  Items Reviewed: Did the pt receive and understand the discharge instructions provided? Yes  Medications obtained and verified? Yes  Other? No  Any new allergies since your discharge? No  Dietary orders reviewed? Yes Do you have support at home    Follow up appointments reviewed:  PCP Hospital f/u appt confirmed? Yes  Has seen Pcp and also the cardiologist  Are transportation arrangements needed? No  If their condition worsens, is the pt aware to call PCP or go to the Emergency Dept.? Yes  Was the patient provided with contact information for the PCP's office or ED? yes Was to pt encouraged to call back with questions or concerns? Yes

## 2023-09-29 ENCOUNTER — Ambulatory Visit: Payer: Federal, State, Local not specified - PPO

## 2023-10-03 ENCOUNTER — Ambulatory Visit (INDEPENDENT_AMBULATORY_CARE_PROVIDER_SITE_OTHER): Payer: Federal, State, Local not specified - PPO

## 2023-10-03 ENCOUNTER — Ambulatory Visit: Payer: Federal, State, Local not specified - PPO

## 2023-10-03 DIAGNOSIS — I442 Atrioventricular block, complete: Secondary | ICD-10-CM

## 2023-10-12 ENCOUNTER — Other Ambulatory Visit: Payer: Self-pay | Admitting: Student

## 2023-10-12 DIAGNOSIS — S0242XD Fracture of alveolus of maxilla, subsequent encounter for fracture with routine healing: Secondary | ICD-10-CM

## 2023-10-26 ENCOUNTER — Encounter: Payer: Federal, State, Local not specified - PPO | Admitting: Internal Medicine

## 2023-11-09 LAB — CUP PACEART REMOTE DEVICE CHECK
Battery Remaining Longevity: 53 mo
Battery Voltage: 2.96 V
Brady Statistic AP VP Percent: 13.36 %
Brady Statistic AP VS Percent: 0 %
Brady Statistic AS VP Percent: 86.52 %
Brady Statistic AS VS Percent: 0.12 %
Brady Statistic RA Percent Paced: 13.38 %
Brady Statistic RV Percent Paced: 99.88 %
Date Time Interrogation Session: 20241205204956
Implantable Lead Connection Status: 753985
Implantable Lead Connection Status: 753985
Implantable Lead Implant Date: 20200504
Implantable Lead Implant Date: 20200504
Implantable Lead Location: 753859
Implantable Lead Location: 753860
Implantable Lead Model: 3830
Implantable Lead Model: 5076
Implantable Pulse Generator Implant Date: 20200504
Lead Channel Impedance Value: 266 Ohm
Lead Channel Impedance Value: 361 Ohm
Lead Channel Impedance Value: 399 Ohm
Lead Channel Impedance Value: 513 Ohm
Lead Channel Pacing Threshold Amplitude: 0.5 V
Lead Channel Pacing Threshold Amplitude: 2.5 V
Lead Channel Pacing Threshold Pulse Width: 0.4 ms
Lead Channel Pacing Threshold Pulse Width: 0.4 ms
Lead Channel Sensing Intrinsic Amplitude: 18.25 mV
Lead Channel Sensing Intrinsic Amplitude: 18.25 mV
Lead Channel Sensing Intrinsic Amplitude: 3.5 mV
Lead Channel Sensing Intrinsic Amplitude: 3.5 mV
Lead Channel Setting Pacing Amplitude: 1.5 V
Lead Channel Setting Pacing Amplitude: 2.5 V
Lead Channel Setting Pacing Pulse Width: 0.8 ms
Lead Channel Setting Sensing Sensitivity: 1.2 mV
Zone Setting Status: 755011
Zone Setting Status: 755011

## 2023-11-20 ENCOUNTER — Ambulatory Visit: Payer: Federal, State, Local not specified - PPO | Admitting: Plastic Surgery

## 2023-11-20 DIAGNOSIS — L905 Scar conditions and fibrosis of skin: Secondary | ICD-10-CM | POA: Diagnosis not present

## 2023-11-20 NOTE — Addendum Note (Signed)
Addended by: Angelita Ingles on: 11/20/2023 09:20 AM   Modules accepted: Orders

## 2023-11-20 NOTE — Progress Notes (Signed)
Ms. Joy Leonard returns today approximately 2 months after her fall.  On evaluation of her lip she still has a small amount of scar tissue on the inside.  She states that this is there and she notices it but she is not having any problems with biting or hitting the scar tissue.  \We discussed 2 options for management.  The first is to continue watching.  As I told her scars will often continue to mature for a 4-year then this may resolve eventually.  The second option is to go to the operating room to excise the scar tissue.  This will decrease the amount of scar tissue that she currently has but there will always be a small scar in the area.  She would like to continue with waiting for the scar to mature.  I think this is very reasonable.  If there is a change and she would like to see me she may return at any time.  Otherwise I will plan on seeing her at the beginning of October at which time will make arrangements to go to the operating room if the scar is still palpable.

## 2024-01-02 ENCOUNTER — Ambulatory Visit: Payer: Federal, State, Local not specified - PPO

## 2024-01-03 ENCOUNTER — Telehealth: Payer: Self-pay | Admitting: Internal Medicine

## 2024-01-03 NOTE — Telephone Encounter (Signed)
 No answer/ no voicemail. Rescheduled pt appointment for 3/10

## 2024-01-03 NOTE — Telephone Encounter (Signed)
 [place] and missed her home remote check on 3/4 and will be back in town on 3/9 and wants to know about rescheduling remote. Requesting cb

## 2024-01-08 ENCOUNTER — Ambulatory Visit

## 2024-01-08 DIAGNOSIS — I442 Atrioventricular block, complete: Secondary | ICD-10-CM

## 2024-01-09 ENCOUNTER — Encounter: Payer: Self-pay | Admitting: Internal Medicine

## 2024-01-09 LAB — CUP PACEART REMOTE DEVICE CHECK
Battery Remaining Longevity: 49 mo
Battery Voltage: 2.95 V
Brady Statistic AP VP Percent: 13.22 %
Brady Statistic AP VS Percent: 0 %
Brady Statistic AS VP Percent: 86.66 %
Brady Statistic AS VS Percent: 0.12 %
Brady Statistic RA Percent Paced: 13.24 %
Brady Statistic RV Percent Paced: 99.87 %
Date Time Interrogation Session: 20250309205039
Implantable Lead Connection Status: 753985
Implantable Lead Connection Status: 753985
Implantable Lead Implant Date: 20200504
Implantable Lead Implant Date: 20200504
Implantable Lead Location: 753859
Implantable Lead Location: 753860
Implantable Lead Model: 3830
Implantable Lead Model: 5076
Implantable Pulse Generator Implant Date: 20200504
Lead Channel Impedance Value: 304 Ohm
Lead Channel Impedance Value: 380 Ohm
Lead Channel Impedance Value: 456 Ohm
Lead Channel Impedance Value: 551 Ohm
Lead Channel Pacing Threshold Amplitude: 0.5 V
Lead Channel Pacing Threshold Amplitude: 2.5 V
Lead Channel Pacing Threshold Pulse Width: 0.4 ms
Lead Channel Pacing Threshold Pulse Width: 0.4 ms
Lead Channel Sensing Intrinsic Amplitude: 18.75 mV
Lead Channel Sensing Intrinsic Amplitude: 18.75 mV
Lead Channel Sensing Intrinsic Amplitude: 4 mV
Lead Channel Sensing Intrinsic Amplitude: 4 mV
Lead Channel Setting Pacing Amplitude: 1.5 V
Lead Channel Setting Pacing Amplitude: 2.5 V
Lead Channel Setting Pacing Pulse Width: 0.8 ms
Lead Channel Setting Sensing Sensitivity: 1.2 mV
Zone Setting Status: 755011
Zone Setting Status: 755011

## 2024-02-07 NOTE — Addendum Note (Signed)
 Addended by: Geralyn Flash D on: 02/07/2024 04:52 PM   Modules accepted: Orders

## 2024-02-07 NOTE — Progress Notes (Signed)
 Remote pacemaker transmission.

## 2024-04-01 ENCOUNTER — Other Ambulatory Visit: Payer: Self-pay | Admitting: Student

## 2024-04-02 ENCOUNTER — Ambulatory Visit: Payer: Federal, State, Local not specified - PPO

## 2024-04-08 ENCOUNTER — Ambulatory Visit (INDEPENDENT_AMBULATORY_CARE_PROVIDER_SITE_OTHER)

## 2024-04-08 DIAGNOSIS — I442 Atrioventricular block, complete: Secondary | ICD-10-CM | POA: Diagnosis not present

## 2024-04-08 LAB — CUP PACEART REMOTE DEVICE CHECK
Battery Remaining Longevity: 40 mo
Battery Voltage: 2.94 V
Brady Statistic AP VP Percent: 11.32 %
Brady Statistic AP VS Percent: 0 %
Brady Statistic AS VP Percent: 88.54 %
Brady Statistic AS VS Percent: 0.14 %
Brady Statistic RA Percent Paced: 11.35 %
Brady Statistic RV Percent Paced: 99.86 %
Date Time Interrogation Session: 20250609022024
Implantable Lead Connection Status: 753985
Implantable Lead Connection Status: 753985
Implantable Lead Implant Date: 20200504
Implantable Lead Implant Date: 20200504
Implantable Lead Location: 753859
Implantable Lead Location: 753860
Implantable Lead Model: 3830
Implantable Lead Model: 5076
Implantable Pulse Generator Implant Date: 20200504
Lead Channel Impedance Value: 304 Ohm
Lead Channel Impedance Value: 380 Ohm
Lead Channel Impedance Value: 418 Ohm
Lead Channel Impedance Value: 551 Ohm
Lead Channel Pacing Threshold Amplitude: 0.5 V
Lead Channel Pacing Threshold Amplitude: 2.5 V
Lead Channel Pacing Threshold Pulse Width: 0.4 ms
Lead Channel Pacing Threshold Pulse Width: 0.4 ms
Lead Channel Sensing Intrinsic Amplitude: 19.625 mV
Lead Channel Sensing Intrinsic Amplitude: 19.625 mV
Lead Channel Sensing Intrinsic Amplitude: 4.5 mV
Lead Channel Sensing Intrinsic Amplitude: 4.5 mV
Lead Channel Setting Pacing Amplitude: 1.5 V
Lead Channel Setting Pacing Amplitude: 2.5 V
Lead Channel Setting Pacing Pulse Width: 0.8 ms
Lead Channel Setting Sensing Sensitivity: 1.2 mV
Zone Setting Status: 755011
Zone Setting Status: 755011

## 2024-04-09 ENCOUNTER — Encounter: Payer: Self-pay | Admitting: *Deleted

## 2024-04-12 ENCOUNTER — Ambulatory Visit: Payer: Self-pay | Admitting: Internal Medicine

## 2024-04-30 ENCOUNTER — Other Ambulatory Visit: Payer: Self-pay | Admitting: Student

## 2024-05-21 NOTE — Progress Notes (Signed)
 Remote pacemaker transmission.

## 2024-05-31 ENCOUNTER — Other Ambulatory Visit: Payer: Self-pay

## 2024-06-11 NOTE — Telephone Encounter (Signed)
 Called patient. Scheduled on 07/08/24 for BP follow up.   Joy JAYSON English, RN

## 2024-07-02 ENCOUNTER — Ambulatory Visit: Payer: Federal, State, Local not specified - PPO

## 2024-07-08 ENCOUNTER — Ambulatory Visit

## 2024-07-08 ENCOUNTER — Other Ambulatory Visit (HOSPITAL_COMMUNITY)
Admission: RE | Admit: 2024-07-08 | Discharge: 2024-07-08 | Disposition: A | Discharge status: Home / Self Care | Source: Ambulatory Visit | Attending: Family Medicine | Admitting: Family Medicine

## 2024-07-08 ENCOUNTER — Ambulatory Visit (INDEPENDENT_AMBULATORY_CARE_PROVIDER_SITE_OTHER)

## 2024-07-08 VITALS — BP 136/88 | HR 85 | Ht 64.0 in | Wt 212.4 lb

## 2024-07-08 DIAGNOSIS — Z1151 Encounter for screening for human papillomavirus (HPV): Secondary | ICD-10-CM | POA: Insufficient documentation

## 2024-07-08 DIAGNOSIS — I10 Essential (primary) hypertension: Secondary | ICD-10-CM

## 2024-07-08 DIAGNOSIS — D500 Iron deficiency anemia secondary to blood loss (chronic): Secondary | ICD-10-CM | POA: Diagnosis not present

## 2024-07-08 DIAGNOSIS — Z124 Encounter for screening for malignant neoplasm of cervix: Secondary | ICD-10-CM | POA: Insufficient documentation

## 2024-07-08 DIAGNOSIS — I442 Atrioventricular block, complete: Secondary | ICD-10-CM | POA: Diagnosis not present

## 2024-07-08 DIAGNOSIS — Z23 Encounter for immunization: Secondary | ICD-10-CM | POA: Diagnosis not present

## 2024-07-08 DIAGNOSIS — Z0000 Encounter for general adult medical examination without abnormal findings: Secondary | ICD-10-CM

## 2024-07-08 DIAGNOSIS — Z01419 Encounter for gynecological examination (general) (routine) without abnormal findings: Secondary | ICD-10-CM | POA: Diagnosis not present

## 2024-07-08 DIAGNOSIS — R7303 Prediabetes: Secondary | ICD-10-CM | POA: Diagnosis not present

## 2024-07-08 LAB — POCT GLYCOSYLATED HEMOGLOBIN (HGB A1C): HbA1c, POC (prediabetic range): 6.2 % (ref 5.7–6.4)

## 2024-07-08 NOTE — Assessment & Plan Note (Signed)
 It sounds uncontrolled on amlodipine  10 mg with patient reporting regularly above 130/80 at home.  She will bring a log to her next visit in one month and we will titrate BP meds accordingly.  Today, we discussed diet modifications and weight loss for improved control.

## 2024-07-08 NOTE — Assessment & Plan Note (Addendum)
 A1c today 6.2. Discussed dietary modifications and working on weight loss.

## 2024-07-08 NOTE — Assessment & Plan Note (Signed)
 Known alpha thalassemia minor. Iron 1 year ago low. Will repeat CBC and anemia panel today

## 2024-07-08 NOTE — Patient Instructions (Addendum)
 Weight - continue your exercise. You are doing great! Fill out the food diary and count the calories for several days before your next appointment  Blood pressure - check your blood pressure at home 3-4 times per week and bring this back to your next visit. Keep taking amlodipine   Fatigue/anemia - we will check your hemoglobin and iron today.   Blood sugar/prediabetes - I will check your A1C today.

## 2024-07-08 NOTE — Progress Notes (Cosign Needed Addendum)
    SUBJECTIVE:   Chief compliant/HPI: annual examination  Joy Leonard is a 58 y.o. who presents today for an annual exam.   History tabs reviewed and updated.   Review of systems form reviewed and notable for fatigue.   OBJECTIVE:   BP 136/88   Pulse 85   Ht 5' 4 (1.626 m)   Wt 212 lb 6.4 oz (96.3 kg)   LMP 09/23/2019   SpO2 97%   BMI 36.46 kg/m     General: well appearing female in NAD CV: RRR, no murmur, peripheral pulse 2+ Pulm: CTAB Abdomen: non tender HEENT: no thyromegaly, no lymphadenopathy Pelvic exam: chaperoned by Thersia Meissner, CMA, no inguinal lymphadenopathy, no external labial erythema or lesions, vagina is pink without lesions, scant blood after pap smear, no abnormal discharge  ASSESSMENT/PLAN:   Assessment & Plan Prediabetes A1c today 6.2. Discussed dietary modifications and working on weight loss. Blood loss anemia Known alpha thalassemia minor. Iron 1 year ago low. Will repeat CBC and anemia panel today Hypertension, unspecified type It sounds uncontrolled on amlodipine  10 mg with patient reporting regularly above 130/80 at home.  She will bring a log to her next visit in one month and we will titrate BP meds accordingly.  Today, we discussed diet modifications and weight loss for improved control.  Morbid obesity (HCC) Provided her with food diary to perform calorie counting. She will follow up with me in a month for further assessment.  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   Considered the following items based upon USPSTF recommendations: Diabetes screening: ordered Screening for elevated cholesterol: plan to order at well visit in 2026 STI testing 1 year ago, negative. Abstinent for the last 5 years.    Cervical cancer screening: due for Pap today, cytology + HPV ordered Breast cancer screening: She has already been called by her mammography office Will schedule appointment soon Colorectal cancer screening: Last colonoscopy in 01/2023, recommendation for repeat in 5 years (2029) Lung cancer screening: not indicated as she is a never smoker.  Vaccinations flu and pneumococcal today.   Follow up in 1 month  MyChart Activation: Already signed up  Joy Tuckett Alena Morrison, MD [facility]

## 2024-07-09 ENCOUNTER — Ambulatory Visit: Payer: Self-pay

## 2024-07-09 LAB — CUP PACEART REMOTE DEVICE CHECK
Battery Remaining Longevity: 36 mo
Battery Voltage: 2.94 V
Brady Statistic AP VP Percent: 12.49 %
Brady Statistic AP VS Percent: 0 %
Brady Statistic AS VP Percent: 87.31 %
Brady Statistic AS VS Percent: 0.2 %
Brady Statistic RA Percent Paced: 12.53 %
Brady Statistic RV Percent Paced: 99.8 %
Date Time Interrogation Session: 20250908023805
Implantable Lead Connection Status: 753985
Implantable Lead Connection Status: 753985
Implantable Lead Implant Date: 20200504
Implantable Lead Implant Date: 20200504
Implantable Lead Location: 753859
Implantable Lead Location: 753860
Implantable Lead Model: 3830
Implantable Lead Model: 5076
Implantable Pulse Generator Implant Date: 20200504
Lead Channel Impedance Value: 285 Ohm
Lead Channel Impedance Value: 361 Ohm
Lead Channel Impedance Value: 418 Ohm
Lead Channel Impedance Value: 532 Ohm
Lead Channel Pacing Threshold Amplitude: 0.5 V
Lead Channel Pacing Threshold Amplitude: 2.5 V
Lead Channel Pacing Threshold Pulse Width: 0.4 ms
Lead Channel Pacing Threshold Pulse Width: 0.4 ms
Lead Channel Sensing Intrinsic Amplitude: 17 mV
Lead Channel Sensing Intrinsic Amplitude: 17 mV
Lead Channel Sensing Intrinsic Amplitude: 3.75 mV
Lead Channel Sensing Intrinsic Amplitude: 3.75 mV
Lead Channel Setting Pacing Amplitude: 1.5 V
Lead Channel Setting Pacing Amplitude: 2.5 V
Lead Channel Setting Pacing Pulse Width: 0.8 ms
Lead Channel Setting Sensing Sensitivity: 1.2 mV
Zone Setting Status: 755011
Zone Setting Status: 755011

## 2024-07-10 LAB — CBC
Hemoglobin: 11.2 g/dL (ref 11.1–15.9)
MCH: 21.7 pg — ABNORMAL LOW (ref 26.6–33.0)
MCHC: 30.4 g/dL — ABNORMAL LOW (ref 31.5–35.7)
MCV: 71 fL — ABNORMAL LOW (ref 79–97)
Platelets: 190 x10E3/uL (ref 150–450)
RBC: 5.17 x10E6/uL (ref 3.77–5.28)
RDW: 19.6 % — ABNORMAL HIGH (ref 11.7–15.4)
WBC: 5.4 x10E3/uL (ref 3.4–10.8)

## 2024-07-10 LAB — ANEMIA PANEL
Ferritin: 108 ng/mL (ref 15–150)
Folate, Hemolysate: 377 ng/mL
Folate, RBC: 1022 ng/mL (ref >498)
Hematocrit: 36.9 % (ref 34.0–46.6)
Iron Saturation: 13 % — ABNORMAL LOW (ref 15–55)
Iron: 40 ug/dL (ref 27–159)
Retic Ct Pct: 1.2 % (ref 0.6–2.6)
Total Iron Binding Capacity: 319 ug/dL (ref 250–450)
UIBC: 279 ug/dL (ref 131–425)
Vitamin B-12: >2000 pg/mL — ABNORMAL HIGH (ref 232–1245)

## 2024-07-10 LAB — CYTOLOGY - PAP
Comment: NEGATIVE
Diagnosis: NEGATIVE
High risk HPV: NEGATIVE

## 2024-07-16 ENCOUNTER — Ambulatory Visit: Payer: Self-pay | Admitting: Internal Medicine

## 2024-07-18 NOTE — Progress Notes (Signed)
 Remote PPM Transmission

## 2024-07-30 NOTE — Progress Notes (Signed)
 Joy Leonard                                          MRN: [id number]   07/30/2024   The VBCI Quality Team Specialist reviewed this patient medical record for the purposes of chart review for care gap closure. The following were reviewed: chart review for care gap closure-kidney health evaluation for diabetes:eGFR  and uACR.    VBCI Quality Team

## 2024-07-31 ENCOUNTER — Other Ambulatory Visit: Payer: Self-pay

## 2024-07-31 DIAGNOSIS — Z1231 Encounter for screening mammogram for malignant neoplasm of breast: Secondary | ICD-10-CM

## 2024-08-01 ENCOUNTER — Ambulatory Visit: Payer: Federal, State, Local not specified - PPO | Admitting: Plastic Surgery

## 2024-08-08 ENCOUNTER — Ambulatory Visit: Payer: Self-pay

## 2024-08-08 VITALS — BP 139/93 | HR 85 | Ht 64.0 in | Wt 215.6 lb

## 2024-08-08 DIAGNOSIS — I10 Essential (primary) hypertension: Secondary | ICD-10-CM

## 2024-08-08 DIAGNOSIS — D171 Benign lipomatous neoplasm of skin and subcutaneous tissue of trunk: Secondary | ICD-10-CM

## 2024-08-08 DIAGNOSIS — S01511D Laceration without foreign body of lip, subsequent encounter: Secondary | ICD-10-CM

## 2024-08-08 DIAGNOSIS — R7989 Other specified abnormal findings of blood chemistry: Secondary | ICD-10-CM

## 2024-08-08 MED ORDER — LOSARTAN POTASSIUM 25 MG PO TABS: 25.0000 mg | ORAL_TABLET | Freq: Every day | ORAL | 3 refills | Status: AC

## 2024-08-08 NOTE — Assessment & Plan Note (Signed)
 Home blood pressures above goal <130/80. Continue amlodipine  10 mg Prescribed losartan 25 mg daily today. She will continue checking her blood pressures at home. Patient will follow-up with Dr. Kristy in a month for blood pressure reassessment.

## 2024-08-08 NOTE — Patient Instructions (Addendum)
 Thank you for recording your blood pressures!  My goal for your blood pressure is less than 130/80.  Since most of your blood pressures are above this lets add another medication today called losartan.  Take this once a day.  You can take at the same time as your amlodipine .  Now you have these 2 medications for your blood pressure.  Keep checking your blood pressures like you have been and bring your log to your next visit.  You can stop taking the vitamin B12 supplement as you have plenty of vitamin B12 in your body/blood based on your last labs.  To track your caloric intake, myfitnesspal is a free app for tracking. There are many other free phone apps that can help you count calories.   If you do not want to use a phone app, write down what you are eating for each meal. Measure the portion size/amount (ie cups, tablespoons, ounces) then read the nutrition facts or google average calories per that amount of food. (It is very important to write the calories for the amount of food you ate and not just a standard portion. For example, if I ate 2 cups of yogurt, and the jar says 1 cup of yogurt has 50 calories, I ate 100 calories of yogurt)  Breakfast      Calories  ________________________________________  _____________________________  ______________________________________________________________________  ______________________________________________________________________  Joy Leonard       Calories  ______________________________________________________________________  ______________________________________________________________________  ______________________________________________________________________  Joy Leonard  ______________________________________________________________________  ______________________________________________________________________  ______________________________________________________________________  Snacks/desserts     Calories  ______________________________________________________________________  ______________________________________________________________________  ______________________________________________________________________  Drinks       Calories  ______________________________________________________________________  ______________________________________________________________________  ______________________________________________________________________  Total calories I ate today: ____________________________   Diet to Lower your Blood Pressure  The DASH diet (dietary approach to stop hypertension) is very good at lowering your blood pressure. Blood pressure control is important because high blood pressure increases your risk of kidney disease/failure and heart disease.  General ideas for the DASH diet:  Eat vegetables, fruits, and whole grains Including fat-free or low-fat dairy products, fish, poultry, beans, nuts, and vegetable oils Limit foods that are high in saturated fat, such as fatty meats, full-fat dairy products, and tropical oils such as coconut, palm kernel, and palm oils Limit sugar-sweetened beverages and sweets  Choose foods that are:  Low in saturated and trans fats Rich in  potassium, calcium, magnesium, fiber, and protein Lower in sodium         DASH diet sample portions for one day  (note: this is for a 2000 calorie diet and is likely not the right portion sizes for the number of calories you should eat in a day)  Food Group     Daily Servings  Grains      6-8 Meats, poultry, and fish   6 or less Vegetables     4-5 Fruit      4-5 Low-fat or fat-free dairy products  2-3 Fats and oils     2-3 Sodium     2,300 mg* *1,500 milligrams (mg) sodium lowers blood pressure even further than 2,300 mg sodium daily.         Weekly Servings  Nuts, seeds, dry beans, and peas  4-5 Sweets     5 or less     For more information   For specific recommended servings for the number of calories you should eat in a day on the DASH diet,  visit: StartupTour.com.cy Or google: Dash Eating plan Scroll down to the what's on my plate? sections  To calculate how many calories you should have in a day, you can use the following resources:  NIH body weight planner: http://roberts.com/ Or Google: NIH body weight planner  (Less specific) MyPlatePlan: CanineTags.co.uk Or Google: Myplateplan Calculator    Please schedule an appointment with me if you would like to review these resources together and talk about specific weight, calorie, and dietary recommendations for your health goals.   Elio Art, MD

## 2024-08-08 NOTE — Progress Notes (Signed)
    SUBJECTIVE:   CHIEF COMPLAINT / HPI:   Her sister noticed a lump on her back when she was visiting this past month.  It does not bother the patient. She has never noticed it before.  She had a history of lip laceration about a year ago.  She notes since then maybe about once a month she feels puffy in that region.  No numbness or tingling.  No notable swelling.  No associated shortness of breath tongue swelling or other mouth swelling.  No lower extremity edema other than after long airplane rides when she sits.  She takes her amlodipine  every day and does not miss doses.  Brings in her blood pressure records: SBP 132 (once), primarily 145-152; DBP 81-97.   PERTINENT  PMH / PSH: Hypertension, lip laceration  OBJECTIVE:   BP (!) 145/89   Pulse 85   Ht 5' 4 (1.626 m)   Wt 215 lb 9.6 oz (97.8 kg)   LMP 09/23/2019   SpO2 99%   BMI 37.01 kg/m   General: Well-appearing woman sitting in her chair Mouth: No visualized scar on the exterior interior of her lips, lips are symmetric, no edema Back: 5 cm soft squishy, mobile, smooth, circumferential mass just medial to the lower half of her left scapula  ASSESSMENT/PLAN:   Assessment & Plan Hypertension, unspecified type Home blood pressures above goal <130/80. Continue amlodipine  10 mg Prescribed losartan 25 mg daily today. She will continue checking her blood pressures at home. Patient will follow-up with Dr. Kristy in a month for blood pressure reassessment. Morbid obesity (HCC) Last month she was unable to make significant dietary changes due to many visitors and celebrations.  She continues to desire to make dietary changes for weight loss and improvement of her blood pressure.  I gave her a handout for diet logging and DASH diet recommendations. Lip laceration, subsequent encounter Suspect her sensations of puffiness are posttraumatic.  Asked her to take a picture the next time this happens.  I am not concerned that this is  angioedema based on history.  Also less likely to be a side effect from amlodipine  giving no other concerns of swelling High serum vitamin B12 She will discontinue her vitamin B12 supplement. Lipoma of torso 5 cm lipoma just medial to the left scapula.  Not bothersome to her at this time.  We can continue to monitor for changes but discussed that this does not need intervention unless it becomes bothersome to her.   She will call back to make a follow-up for continued weight loss counseling and hypertension management in January on one of her days off once she gets her schedule and my clinic schedule is released.  Donterrius Santucci Alena Morrison, MD [facility]

## 2024-08-08 NOTE — Assessment & Plan Note (Signed)
 Suspect her sensations of puffiness are posttraumatic.  Asked her to take a picture the next time this happens.  I am not concerned that this is angioedema based on history.  Also less likely to be a side effect from amlodipine  giving no other concerns of swelling

## 2024-08-09 ENCOUNTER — Ambulatory Visit
Admission: RE | Admit: 2024-08-09 | Discharge: 2024-08-09 | Disposition: A | Discharge status: Home / Self Care | Source: Ambulatory Visit

## 2024-08-09 DIAGNOSIS — Z1231 Encounter for screening mammogram for malignant neoplasm of breast: Secondary | ICD-10-CM

## 2024-08-13 ENCOUNTER — Ambulatory Visit: Payer: Self-pay

## 2024-08-16 NOTE — Progress Notes (Signed)
 Joy Leonard                                          MRN: [id number]   08/16/2024   The VBCI Quality Team Specialist reviewed this patient medical record for the purposes of chart review for care gap closure. The following were reviewed: chart review for care gap closure-controlling blood pressure.    VBCI Quality Team

## 2024-08-23 ENCOUNTER — Other Ambulatory Visit: Payer: Self-pay

## 2024-09-01 NOTE — Assessment & Plan Note (Signed)
 Patient's blood pressure {is/is not:320031} controlled today.  . Goal of {BPGOAL:29904}. Patient's medication regimen includes: Losartan 25 mg daily and Amlodipine  10 mg daily. - Changes to current regimen include *** - Referral to pharmacy clinic due to {KOVALBPCRITERIA:29903} - Labs: BMP, urine microalbumin/creatinine *** - BP log added to patient's AVS*** - Follow up in *** weeks

## 2024-09-01 NOTE — Progress Notes (Unsigned)
 Patient cancelled visit

## 2024-09-04 ENCOUNTER — Ambulatory Visit (INDEPENDENT_AMBULATORY_CARE_PROVIDER_SITE_OTHER): Payer: Self-pay | Admitting: Family Medicine

## 2024-09-04 DIAGNOSIS — I10 Essential (primary) hypertension: Secondary | ICD-10-CM

## 2024-09-13 ENCOUNTER — Other Ambulatory Visit: Payer: Self-pay

## 2024-10-01 ENCOUNTER — Ambulatory Visit: Payer: Federal, State, Local not specified - PPO

## 2024-10-07 ENCOUNTER — Ambulatory Visit

## 2024-10-07 DIAGNOSIS — I442 Atrioventricular block, complete: Secondary | ICD-10-CM | POA: Diagnosis not present

## 2024-10-08 LAB — CUP PACEART REMOTE DEVICE CHECK
Battery Remaining Longevity: 31 mo
Battery Voltage: 2.93 V
Brady Statistic AP VP Percent: 16.26 %
Brady Statistic AP VS Percent: 0 %
Brady Statistic AS VP Percent: 83.61 %
Brady Statistic AS VS Percent: 0.13 %
Brady Statistic RA Percent Paced: 16.28 %
Brady Statistic RV Percent Paced: 99.87 %
Date Time Interrogation Session: 20251207232643
Implantable Lead Connection Status: 753985
Implantable Lead Connection Status: 753985
Implantable Lead Implant Date: 20200504
Implantable Lead Implant Date: 20200504
Implantable Lead Location: 753859
Implantable Lead Location: 753860
Implantable Lead Model: 3830
Implantable Lead Model: 5076
Implantable Pulse Generator Implant Date: 20200504
Lead Channel Impedance Value: 266 Ohm
Lead Channel Impedance Value: 361 Ohm
Lead Channel Impedance Value: 399 Ohm
Lead Channel Impedance Value: 532 Ohm
Lead Channel Pacing Threshold Amplitude: 0.5 V
Lead Channel Pacing Threshold Amplitude: 2.5 V
Lead Channel Pacing Threshold Pulse Width: 0.4 ms
Lead Channel Pacing Threshold Pulse Width: 0.4 ms
Lead Channel Sensing Intrinsic Amplitude: 13 mV
Lead Channel Sensing Intrinsic Amplitude: 13 mV
Lead Channel Sensing Intrinsic Amplitude: 3 mV
Lead Channel Sensing Intrinsic Amplitude: 3 mV
Lead Channel Setting Pacing Amplitude: 1.5 V
Lead Channel Setting Pacing Amplitude: 2.5 V
Lead Channel Setting Pacing Pulse Width: 0.8 ms
Lead Channel Setting Sensing Sensitivity: 1.2 mV
Zone Setting Status: 755011
Zone Setting Status: 755011

## 2024-10-09 ENCOUNTER — Ambulatory Visit: Payer: Self-pay | Admitting: Internal Medicine

## 2024-10-15 NOTE — Progress Notes (Signed)
 Remote PPM Transmission

## 2024-12-31 ENCOUNTER — Ambulatory Visit: Payer: Federal, State, Local not specified - PPO

## 2025-01-06 ENCOUNTER — Ambulatory Visit

## 2025-04-01 ENCOUNTER — Ambulatory Visit: Payer: Federal, State, Local not specified - PPO

## 2025-04-07 ENCOUNTER — Ambulatory Visit

## 2025-07-01 ENCOUNTER — Ambulatory Visit: Payer: Federal, State, Local not specified - PPO

## 2025-07-08 ENCOUNTER — Ambulatory Visit
